# Patient Record
Sex: Female | Born: 1980
Health system: Southern US, Community
[De-identification: ages and names within clinical notes are randomized; demographics above are authoritative.]

## PROBLEM LIST (undated history)

## (undated) DIAGNOSIS — E785 Hyperlipidemia, unspecified: Secondary | ICD-10-CM

## (undated) DIAGNOSIS — G43909 Migraine, unspecified, not intractable, without status migrainosus: Secondary | ICD-10-CM

## (undated) DIAGNOSIS — O905 Postpartum thyroiditis: Secondary | ICD-10-CM

## (undated) HISTORY — DX: Migraine, unspecified, not intractable, without status migrainosus: G43.909

## (undated) HISTORY — DX: Hyperlipidemia, unspecified: E78.5

## (undated) HISTORY — DX: Postpartum thyroiditis: O90.5

## (undated) HISTORY — PX: WISDOM TOOTH EXTRACTION: SHX21

---

## 2003-07-07 ENCOUNTER — Emergency Department (HOSPITAL_COMMUNITY): Admission: EM | Admit: 2003-07-07 | Discharge: 2003-07-08 | Payer: Self-pay | Admitting: Emergency Medicine

## 2009-05-18 ENCOUNTER — Encounter: Payer: Self-pay | Admitting: Family Medicine

## 2009-05-18 LAB — CONVERTED CEMR LAB: Pap Smear: NORMAL

## 2010-02-11 ENCOUNTER — Inpatient Hospital Stay (HOSPITAL_COMMUNITY): Admission: AD | Admit: 2010-02-11 | Discharge: 2010-02-11 | Payer: Self-pay | Admitting: Obstetrics & Gynecology

## 2010-02-11 ENCOUNTER — Ambulatory Visit: Payer: Self-pay | Admitting: Obstetrics & Gynecology

## 2010-02-25 ENCOUNTER — Ambulatory Visit: Payer: Self-pay | Admitting: Family Medicine

## 2010-02-25 DIAGNOSIS — E7800 Pure hypercholesterolemia, unspecified: Secondary | ICD-10-CM

## 2010-02-25 DIAGNOSIS — N926 Irregular menstruation, unspecified: Secondary | ICD-10-CM | POA: Insufficient documentation

## 2010-02-25 DIAGNOSIS — G43909 Migraine, unspecified, not intractable, without status migrainosus: Secondary | ICD-10-CM | POA: Insufficient documentation

## 2010-03-08 ENCOUNTER — Encounter: Payer: Self-pay | Admitting: Family Medicine

## 2010-03-08 DIAGNOSIS — J309 Allergic rhinitis, unspecified: Secondary | ICD-10-CM | POA: Insufficient documentation

## 2010-03-25 ENCOUNTER — Ambulatory Visit: Payer: Self-pay | Admitting: Family Medicine

## 2010-03-25 DIAGNOSIS — J019 Acute sinusitis, unspecified: Secondary | ICD-10-CM

## 2010-05-22 ENCOUNTER — Ambulatory Visit
Admission: RE | Admit: 2010-05-22 | Discharge: 2010-05-22 | Payer: Self-pay | Source: Home / Self Care | Attending: Family Medicine | Admitting: Family Medicine

## 2010-05-22 ENCOUNTER — Other Ambulatory Visit: Payer: Self-pay | Admitting: Family Medicine

## 2010-05-22 ENCOUNTER — Encounter: Payer: Self-pay | Admitting: Family Medicine

## 2010-05-22 ENCOUNTER — Other Ambulatory Visit
Admission: RE | Admit: 2010-05-22 | Discharge: 2010-05-22 | Payer: Self-pay | Source: Home / Self Care | Admitting: Family Medicine

## 2010-05-22 LAB — CYTOLOGY - PAP: Pap Smear: NORMAL

## 2010-05-23 LAB — CONVERTED CEMR LAB
ALT: 11 units/L (ref 0–35)
AST: 17 units/L (ref 0–37)
Albumin: 4.4 g/dL (ref 3.5–5.2)
Alkaline Phosphatase: 77 units/L (ref 39–117)
BUN: 11 mg/dL (ref 6–23)
CO2: 23 meq/L (ref 19–32)
Calcium: 9.8 mg/dL (ref 8.4–10.5)
Chloride: 103 meq/L (ref 96–112)
Cholesterol: 167 mg/dL (ref 0–200)
Creatinine, Ser: 0.92 mg/dL (ref 0.40–1.20)
Glucose, Bld: 71 mg/dL (ref 70–99)
HDL: 49 mg/dL (ref >39)
LDL Cholesterol: 96 mg/dL (ref 0–99)
Potassium: 4.1 meq/L (ref 3.5–5.3)
Sodium: 138 meq/L (ref 135–145)
Total Bilirubin: 0.6 mg/dL (ref 0.3–1.2)
Total CHOL/HDL Ratio: 3.4
Total Protein: 7.3 g/dL (ref 6.0–8.3)
Triglycerides: 109 mg/dL (ref <150)
VLDL: 22 mg/dL (ref 0–40)

## 2010-06-04 NOTE — Assessment & Plan Note (Signed)
Summary: NOV irreg menses/ migraines   Vital Signs:  Patient profile:   30 year old female Height:      64.5 inches Weight:      123 pounds BMI:     20.86 O2 Sat:      75 % on Room air Pulse rate:   75 / minute BP sitting:   121 / 78  (left arm) Cuff size:   regular  Vitals Entered By: Payton Spark CMA (February 25, 2010 11:20 AM)  O2 Flow:  Room air CC: New to est. Refill medications.   Primary Care Provider:  Seymour Bars DO  CC:  New to est. Refill medications..  History of Present Illness: 30 yo WF presents for NOV.  Transferring from [facility].  She has been on Simvastatin 40 mg/ day for about a year.  She has hereditary hyperlipidemia.  She had labs checked 02-13-10 and was told everything was OK.  Her dad has high cholesterol.  She is on Verapamil for migraine prevention with Frova for rescue.  On Zovia for birth control this was started 10-6 after she had breakthrough menorrhagia on Jolessa.    She sees OB-- [place]. Menstrual migraine, doing Zovia with extended cycling.      Habits & Providers  Alcohol-Tobacco-Diet     Alcohol drinks/day: <1     Tobacco Status: never  Exercise-Depression-Behavior     Does Patient Exercise: yes     Have you felt down or hopeless? no     STD Risk: never     Drug Use: never     Seat Belt Use: always  Past History:  Past Medical History: migraines hereditary hyperlipidemia G0  Past Surgical History: none  Family History: father AMI, died at 32, high cholesterol PGF AMI, 24 mother healthy sister healthy  Social History: Never smoked. rare ETOH. 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 [place], no kids.Smoking Status:  never Seat Belt Use:  always Does Patient Exercise:  yes STD Risk:  never Drug Use:  never  Review of Systems       no fevers/sweats/weakness, unexplained wt loss/gain, no change in vision, no difficulty hearing, ringing in ears, no hay fever/allergies, no CP/discomfort, no  palpitations, no breast lump/nipple discharge, no cough/wheeze, no blood in stool, no N/V/D, no nocturia, no leaking urine, no unusual vag bleeding, no vaginal/penile discharge, no muscle/joint pain, no rash, no new/changing mole, no HA, no memory loss, no anxiety, no sleep problem, no depression, no unexplained lumps, no easy bruising/bleeding, no concern with sexual function   Physical Exam  General:  alert, well-developed, well-nourished, and well-hydrated.   Head:  normocephalic and atraumatic.   Mouth:  good dentition and pharynx pink and moist.   Neck:  no masses.   Lungs:  Normal respiratory effort, chest expands symmetrically. Lungs are clear to auscultation, no crackles or wheezes. Heart:  Normal rate and regular rhythm. S1 and S2 normal without gallop, murmur, click, rub or other extra sounds. Skin:  color normal.   Cervical Nodes:  No lymphadenopathy noted Psych:  good eye contact, not anxious appearing, and not depressed appearing.     Impression & Recommendations:  Problem # 1:  IRREGULAR MENSTRUAL CYCLE (ICD-626.4) Assessment Improved Improved with change from [place] to [place] about 3 wks ago.  Continue, doing extended cycling for migraine prevention. Schedule PAP in Jan. Call if any more breakthrough bleeding occurs.   Her updated medication list for this problem includes:    Zovia  1/35e (28) 1-35 Mg-mcg Tabs (Ethynodiol diac-eth estradiol) .Marland Kitchen... Take 1 tab by mouth once daily  Problem # 2:  PURE HYPERCHOLESTEROLEMIA (ICD-272.0) Hereditary.  Just had labs drawn.  Will call for report.  On OCPs and discussed that when she does want to conceive, we will stop her Simvastatin.     Her updated medication list for this problem includes:    Simvastatin 40 Mg Tabs (Simvastatin) .Marland Kitchen... Take 1 tab by mouth at bedtime  Complete Medication List: 1)  Simvastatin 40 Mg Tabs (Simvastatin) .... Take 1 tab by mouth at bedtime 2)  Verapamil Hcl Cr 240 Mg Cr-tabs (Verapamil hcl) ....  Take 1 tab by mouth once daily 3)  Zovia 1/35e (28) 1-35 Mg-mcg Tabs (Ethynodiol diac-eth estradiol) .... Take 1 tab by mouth once daily 4)  Frova 2.5 Mg Tabs (Frovatriptan succinate) .... Take as directed as needed for migraines  Patient Instructions: 1)  Stay on current meds. 2)  Have pharmacy contact us for any RFs that you may need. 3)  Schedule CPE with PAP the end of Jan.   Orders Added: 1)  New Patient Level III [99203]  Appended Document: NOV irreg menses/ migraines     Vital Signs:  Patient profile:   30 year old female O2 Sat:      100 % on Room air  O2 Flow:  Room air   Complete Medication List: 1)  Simvastatin 40 Mg Tabs (Simvastatin) .... Take 1 tab by mouth at bedtime 2)  Verapamil Hcl Cr 240 Mg Cr-tabs (Verapamil hcl) .... Take 1 tab by mouth once daily 3)  Zovia 1/35e (28) 1-35 Mg-mcg Tabs (Ethynodiol diac-eth estradiol) .... Take 1 tab by mouth once daily 4)  Frova 2.5 Mg Tabs (Frovatriptan succinate) .... Take as directed as needed for migraines       * See corrected O2 SAT

## 2010-06-04 NOTE — Miscellaneous (Signed)
Summary: old records  Clinical Lists Changes  Problems: Added new problem of ALLERGIC RHINITIS (ICD-477.9) Allergies: Added new allergy or adverse reaction of IMITREX Observations: Added new observation of PAP DUE: 05/18/2010 (03/08/2010 16:53) Added new observation of TDBOOSTDUE: 12/27/2013 (03/08/2010 16:53) Added new observation of PAST SURG HX: wisdom teeth (03/08/2010 16:53) Added new observation of PAST MED HX: migraines hereditary hyperlipidemia G1P0010  had HPV vaccine 2007 (03/08/2010 16:53) Added new observation of NKA: F (03/08/2010 16:53) Added new observation of FAMILY HX: father AMI, died at 63, high cholesterol PGF AMI, 66 mother  alive, high chol sister healthy (03/08/2010 16:53) Added new observation of PRIMARY MD: Seymour Bars DO (03/08/2010 16:53) Added new observation of LAST PAP DAT: 05/18/2009 (05/18/2009 16:59) Added new observation of PAP SMEAR: normal (05/18/2009 16:59) Added new observation of TD BOOSTER: given (12/28/2003 16:56)       Past History:  Past Medical History: migraines hereditary hyperlipidemia G1P0010  had HPV vaccine 2007  Past Surgical History: wisdom teeth   Family History: father AMI, died at 31, high cholesterol PGF AMI, 77 mother  alive, high chol sister healthy    TD Result Date:  12/28/2003 TD Result:  given TD Next Due:  10 yr PAP Result Date:  05/18/2009 PAP Result:  normal PAP Next Due:  1 yr

## 2010-06-04 NOTE — Assessment & Plan Note (Signed)
Summary: Sinusitis   Vital Signs:  Patient profile:   30 year old female Height:      64.5 inches Weight:      123 pounds Temp:     98.0 degrees F oral Pulse rate:   78 / minute BP sitting:   122 / 85  (right arm) Cuff size:   regular  Vitals Entered By: Avon Gully CMA, Duncan Dull) (March 25, 2010 10:42 AM) CC: sinus congestion x 1 weeks, cough so much back hurts   Primary Care Provider:  Seymour Bars DO  CC:  sinus congestion x 1 weeks and cough so much back hurts.  History of Present Illness: sinus congestion x 1 weeks, cough so much back hurts.  Last Wednesday started with burning throat and then post nasal drip. Then over the weekend has severe sinus congestion which is triggering her migraines. + runny nose. Cough. deeper in her chest.  No fever.  No cough or cough meds. No ear pain. Some bilat jaw pain.  + nauseated. No change in bowels.  No allegy hx.   Current Medications (verified): 1)  Simvastatin 40 Mg Tabs (Simvastatin) .... Take 1 Tab By Mouth At Bedtime 2)  Verapamil Hcl Cr 240 Mg Cr-Tabs (Verapamil Hcl) .... Take 1 Tab By Mouth Once Daily 3)  Zovia 1/35e (28) 1-35 Mg-Mcg Tabs (Ethynodiol Diac-Eth Estradiol) .... Take 1 Tab By Mouth Once Daily 4)  Frova 2.5 Mg Tabs (Frovatriptan Succinate) .... Take As Directed As Needed For Migraines  Allergies (verified): 1)  ! Imitrex  Comments:  Nurse/Medical Assistant: The patient's medications and allergies were reviewed with the patient and were updated in the Medication and Allergy Lists. Avon Gully CMA, Duncan Dull) (March 25, 2010 10:43 AM)  Physical Exam  General:  Well-developed,well-nourished,in no acute distress; alert,appropriate and cooperative throughout examination Head:  Normocephalic and atraumatic without obvious abnormalities. No apparent alopecia or balding. Eyes:  No corneal or conjunctival inflammation noted. EOMI. Perrla.  Ears:  External ear exam shows no significant lesions or deformities.   Otoscopic examination reveals clear canals, tympanic membranes are intact bilaterally without bulging, retraction, inflammation or discharge. Hearing is grossly normal bilaterally. Nose:  External nasal examination shows no deformity or inflammation. Nasal mucosa are pink and moist without lesions or exudates. Mouth:  Oral mucosa and oropharynx without lesions or exudates.  Teeth in good repair. Neck:  No deformities, masses, or tenderness noted. Lungs:  Normal respiratory effort, chest expands symmetrically. Lungs are clear to auscultation, no crackles or wheezes. Heart:  Normal rate and regular rhythm. S1 and S2 normal without gallop, murmur, click, rub or other extra sounds. Skin:  no rashes.   Cervical Nodes:  No lymphadenopathy noted Psych:  Cognition and judgment appear intact. Alert and cooperative with normal attention span and concentration. No apparent delusions, illusions, hallucinations   Impression & Recommendations:  Problem # 1:  SINUSITIS - ACUTE-NOS (ICD-461.9)  Her updated medication list for this problem includes:    Amoxicillin 875 Mg Tabs (Amoxicillin) .Marland Kitchen... Take 1 tablet by mouth two times a day for 10 days .    Hydrocodone-homatropine 5-1.5 Mg/74ml Syrp (Hydrocodone-homatropine) .Marland KitchenMarland KitchenMarland KitchenMarland Kitchen 5ml by mouth at bedtime as needed for cough  Instructed on treatment. Call if symptoms persist or worsen. I did warn her that the antiobiotic can decrease the effectiveness of ehr OCPs.    Complete Medication List: 1)  Simvastatin 40 Mg Tabs (Simvastatin) .... Take 1 tab by mouth at bedtime 2)  Verapamil Hcl Cr 240 Mg Cr-tabs (Verapamil  hcl) .... Take 1 tab by mouth once daily 3)  Zovia 1/35e (28) 1-35 Mg-mcg Tabs (Ethynodiol diac-eth estradiol) .... Take 1 tab by mouth once daily 4)  Frova 2.5 Mg Tabs (Frovatriptan succinate) .... Take as directed as needed for migraines 5)  Amoxicillin 875 Mg Tabs (Amoxicillin) .... Take 1 tablet by mouth two times a day for 10 days . 6)   Hydrocodone-homatropine 5-1.5 Mg/62ml Syrp (Hydrocodone-homatropine) .... 5ml by mouth at bedtime as needed for cough  Patient Instructions: 1)  Call if not better in one week 2)  Please fill the antibiotic if you are not better by the end of the week 3)  The cough medicine can be sedating so don't take it and then drive.  Prescriptions: HYDROCODONE-HOMATROPINE 5-1.5 MG/5ML SYRP (HYDROCODONE-HOMATROPINE) 5ml by mouth at bedtime as needed for cough  #120 ml x 0   Entered and Authorized by:   Nani Gasser MD   Signed by:   Nani Gasser MD on 03/25/2010   Method used:   Print then Give to Patient   RxID:   [id number] AMOXICILLIN 875 MG TABS (AMOXICILLIN) Take 1 tablet by mouth two times a day for 10 days .  #20 x 0   Entered and Authorized by:   Nani Gasser MD   Signed by:   Nani Gasser MD on 03/25/2010   Method used:   Print then Give to Patient   RxID:   [id number]    Orders Added: 1)  Est. Patient Level III [38756]

## 2010-06-04 NOTE — Letter (Signed)
Summary: Records Dated 05-18-09 thru 02-05-10/[facility]   Records Dated 05-18-09 thru 02-05-10/[facility]   Imported By: Lanelle Bal 03/20/2010 10:21:45  _____________________________________________________________________  External Attachment:    Type:   Image     Comment:   External Document

## 2010-06-06 NOTE — Assessment & Plan Note (Signed)
Summary: CPE with pap   Vital Signs:  Patient profile:   30 year old female Height:      64.5 inches Weight:      123 pounds BMI:     20.86 Pulse rate:   91 / minute BP sitting:   118 / 80  (right arm) Cuff size:   regular  Vitals Entered By: Avon Gully CMA, Duncan Dull) (May 22, 2010 8:19 AM) CC: CPE and pap   Primary Care Provider:  Seymour Bars DO  CC:  CPE and pap.  History of Present Illness: 30 yo WF, nulligravid with hereditary hyperlipidemia and migraines is here for CPE with pap.  she is due for fasting labs.  Doing well on current meds with Zovia for birth control. She and her husband have decided to try and start a family later this year.  She understands that she will need to stop all of her meds when she stops her Zovia.    Denies fam hx of colon or breast cancer.  She does have fam hx of premature heart dz.  she eats healthy, exercises, takes her Simvastatin, has excellent BP and does not smoke.  She denies any CP or DOE.    She thinks her tetanus is up to date.  She is married, monogamous without hx of abnormal pap smear.    Current Medications (verified): 1)  Simvastatin 40 Mg Tabs (Simvastatin) .... Take 1 Tab By Mouth At Bedtime 2)  Verapamil Hcl Cr 240 Mg Cr-Tabs (Verapamil Hcl) .... Take 1 Tab By Mouth Once Daily 3)  Zovia 1/35e (28) 1-35 Mg-Mcg Tabs (Ethynodiol Diac-Eth Estradiol) .... Take 1 Tab By Mouth Once Daily 4)  Frova 2.5 Mg Tabs (Frovatriptan Succinate) .... Take As Directed As Needed For Migraines  Allergies (verified): 1)  ! Imitrex  Comments:  Nurse/Medical Assistant: The patient's medications and allergies were reviewed with the patient and were updated in the Medication and Allergy Lists. Avon Gully CMA, Duncan Dull) (May 22, 2010 8:20 AM)  Past History:  Past Medical History: Reviewed history from 03/08/2010 and no changes required. migraines hereditary hyperlipidemia G1P0010  had HPV vaccine 2007  Family  History: Reviewed history from 03/08/2010 and no changes required. father AMI, died at 7, high cholesterol PGF AMI, 58 mother  alive, high chol sister healthy  Social History: Reviewed history from 02/25/2010 and no changes required. Never smoked. rare ETOH. 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  Review of Systems  The patient denies anorexia, fever, weight loss, weight gain, vision loss, decreased hearing, hoarseness, chest pain, syncope, dyspnea on exertion, peripheral edema, prolonged cough, headaches, hemoptysis, abdominal pain, melena, hematochezia, severe indigestion/heartburn, hematuria, incontinence, genital sores, muscle weakness, suspicious skin lesions, transient blindness, difficulty walking, depression, unusual weight change, abnormal bleeding, enlarged lymph nodes, angioedema, breast masses, and testicular masses.    Physical Exam  General:  alert, well-developed, well-nourished, and well-hydrated.   Head:  normocephalic and atraumatic.   Ears:  no external deformities.   Nose:  no nasal discharge.   Mouth:  good dentition and pharynx pink and moist.   Neck:  no masses.   Breasts:  No mass, nodules, thickening, tenderness, bulging, retraction, inflamation, nipple discharge or skin changes noted.   Lungs:  Normal respiratory effort, chest expands symmetrically. Lungs are clear to auscultation, no crackles or wheezes. Heart:  Normal rate and regular rhythm. S1 and S2 normal without gallop, murmur, click, rub or other extra sounds. Abdomen:  Bowel sounds positive,abdomen soft and non-tender without masses, organomegaly or hernias noted. Genitalia:  Pelvic Exam:        External: normal female genitalia without lesions or masses        Vagina: normal without lesions or masses        Cervix: normal without lesions or masses        Adnexa: normal bimanual exam without masses or fullness        Uterus: normal by palpation        Pap  smear: performed Pulses:  2+ radial and pedal pulses Extremities:  no LE edema Skin:  color normal and no suspicious lesions.   Cervical Nodes:  No lymphadenopathy noted Psych:  good eye contact, not anxious appearing, and not depressed appearing.     Impression & Recommendations:  Problem # 1:  ROUTINE GYNECOLOGICAL EXAMINATION (ICD-V72.31) Keeping healthy checlist for women reviewed. BP at goal. Meds RFd. Thin prep pap today. Fasting labs today. Tetanus UTD. Cardiac stress testing at 35 due to fam hx. Discussed family planning.  Pt will STOP her Verapamil (for migraine prevention) and Simvastatin and Frova when she comes off her Zovia.    Complete Medication List: 1)  Simvastatin 40 Mg Tabs (Simvastatin) .... Take 1 tab by mouth at bedtime 2)  Verapamil Hcl Cr 240 Mg Cr-tabs (Verapamil hcl) .... Take 1 tab by mouth once daily 3)  Zovia 1/35e (28) 1-35 Mg-mcg Tabs (Ethynodiol diac-eth estradiol) .... Take 1 tab by mouth once daily 4)  Frova 2.5 Mg Tabs (Frovatriptan succinate) .... Take as directed as needed for migraines  Other Orders: T-Comprehensive Metabolic Panel [id number]) T-Lipid Profile ([id number]) Prescriptions: FROVA 2.5 MG TABS (FROVATRIPTAN SUCCINATE) Take as directed as needed for migraines  #9 x 2   Entered and Authorized by:   Seymour Bars DO   Signed by:   Seymour Bars DO on 05/22/2010   Method used:   Print then Give to Patient   RxID:   [id number] ZOVIA 1/35E (28) 1-35 MG-MCG TABS (ETHYNODIOL DIAC-ETH ESTRADIOL) Take 1 tab by mouth once daily  #3packs x 3   Entered and Authorized by:   Seymour Bars DO   Signed by:   Seymour Bars DO on 05/22/2010   Method used:   Print then Give to Patient   RxID:   [id number] SIMVASTATIN 40 MG TABS (SIMVASTATIN) Take 1 tab by mouth at bedtime  #90 x 1   Entered and Authorized by:   Seymour Bars DO   Signed by:   Seymour Bars DO on 05/22/2010   Method used:   Print then Give to Patient   RxID:    [id number] VERAPAMIL HCL CR 240 MG CR-TABS (VERAPAMIL HCL) Take 1 tab by mouth once daily  #90 x 1   Entered and Authorized by:   Seymour Bars DO   Signed by:   Seymour Bars DO on 05/22/2010   Method used:   Electronically to        [facility] [address] (retail)       [address] [place]       [place], [place]  [address]       Ph: [phone]       Fax: [phone]   RxID:   [id number]    Orders Added: 1)  T-Comprehensive Metabolic Panel [[id number]] 2)  T-Lipid Profile [[id number]] 3)  Est. Patient age 37-39 [phone]

## 2010-06-16 ENCOUNTER — Encounter: Payer: Self-pay | Admitting: Family Medicine

## 2010-07-02 NOTE — Letter (Signed)
Summary: [facility]  [facility]   Imported By: Maryln Gottron 06/24/2010 15:27:45  _____________________________________________________________________  External Attachment:    Type:   Image     Comment:   External Document

## 2010-07-18 LAB — CBC
HCT: 40.1 % (ref 36.0–46.0)
MCH: 31.5 pg (ref 26.0–34.0)
MCHC: 35 g/dL (ref 30.0–36.0)
MCV: 90 fL (ref 78.0–100.0)
RDW: 12.8 % (ref 11.5–15.5)

## 2010-07-18 LAB — URINALYSIS, ROUTINE W REFLEX MICROSCOPIC
Bilirubin Urine: NEGATIVE
Glucose, UA: NEGATIVE mg/dL
Ketones, ur: NEGATIVE mg/dL
Leukocytes, UA: NEGATIVE
Nitrite: NEGATIVE
Protein, ur: NEGATIVE mg/dL
Specific Gravity, Urine: 1.02 (ref 1.005–1.030)
Urobilinogen, UA: 0.2 mg/dL (ref 0.0–1.0)
pH: 6.5 (ref 5.0–8.0)

## 2010-07-18 LAB — WET PREP, GENITAL

## 2010-07-18 LAB — POCT PREGNANCY, URINE: Preg Test, Ur: NEGATIVE

## 2010-07-18 LAB — URINE MICROSCOPIC-ADD ON

## 2010-11-07 ENCOUNTER — Other Ambulatory Visit: Payer: Self-pay | Admitting: Family Medicine

## 2010-11-25 ENCOUNTER — Telehealth: Payer: Self-pay | Admitting: *Deleted

## 2010-11-25 DIAGNOSIS — G43909 Migraine, unspecified, not intractable, without status migrainosus: Secondary | ICD-10-CM

## 2010-11-25 NOTE — Telephone Encounter (Signed)
Pt states she is currently seeing Dr. Orie Rout but would like a new referral to Dr. Vallery Ridge at [facility] bc she not happy w/ Dr. Demetrius Charity  please advise.

## 2010-11-25 NOTE — Telephone Encounter (Signed)
done

## 2011-03-02 ENCOUNTER — Encounter: Payer: Self-pay | Admitting: Family Medicine

## 2011-03-06 ENCOUNTER — Encounter: Payer: Self-pay | Admitting: Family Medicine

## 2011-03-06 ENCOUNTER — Ambulatory Visit (INDEPENDENT_AMBULATORY_CARE_PROVIDER_SITE_OTHER): Payer: BC Managed Care – PPO | Admitting: Family Medicine

## 2011-03-06 DIAGNOSIS — E785 Hyperlipidemia, unspecified: Secondary | ICD-10-CM

## 2011-03-06 MED ORDER — COLESEVELAM HCL 3.75 G PO PACK: PACK | ORAL | Status: DC

## 2011-03-06 NOTE — Progress Notes (Signed)
  Subjective:    Patient ID: Tina Pearson, female    DOB: 08/22/1980, 30 y.o.   MRN: [id number]  HPI  Here to f/u up on cholesterol. She if very fearful of heart dz since her father died early. She knows she can't take statins since she is trying to get pregnant but wants to be on something.   Review of Systems     Objective:   Physical Exam  Constitutional: She is oriented to person, place, and time. She appears well-developed and well-nourished.       Very thin female  HENT:  Head: Normocephalic and atraumatic.  Cardiovascular: Normal rate, regular rhythm and normal heart sounds.   Pulmonary/Chest: Effort normal and breath sounds normal.  Neurological: She is alert and oriented to person, place, and time.  Skin: Skin is warm and dry.  Psychiatric: She has a normal mood and affect. Her behavior is normal.          Assessment & Plan:  Hyperlipidemia- I told her that we could hold off on closer medication while she is trying to get pregnant during her pregnancy. She is really worried about the potential for future heart disease and really wants to take something. discussed can use Welchol, it is category B. there are limited studies in the first trimester but has been studied in the second and third trimester and is considered safe. I discussed with her that she can certainly start it now and when she finds out she is pregnant she can stop the medication for the first trimester and then restarted during her second trimester. Discussed options of pills versus the pallor. She prefers the powder. I explained to her that we will not recheck blood work as this is not a medication when the dose is titrated. There is only a single dosing. It really doesn't matter how much it does or doesn't lower her cholesterol and so she is completed her pregnancy and nursing and is able to restart a statin.

## 2011-03-14 ENCOUNTER — Telehealth: Payer: Self-pay | Admitting: *Deleted

## 2011-03-14 NOTE — Telephone Encounter (Signed)
Unfortunately we really don't have any other options that are safe in pregnancy. Will have to start something after has a baby.

## 2011-03-14 NOTE — Telephone Encounter (Signed)
Pt calls and states she can not take the cholesterol med you put her on at last visit. States its making her feel really sick and request a different med

## 2011-03-14 NOTE — Telephone Encounter (Signed)
Can try OTC niacin once a day and this should be safe. Rx version of niacin are contraindicated.

## 2011-03-14 NOTE — Telephone Encounter (Signed)
Pt notified of MD instructions via VM. KJ LPN 

## 2011-03-14 NOTE — Telephone Encounter (Signed)
Pt calls back and states she is not pregnant right now. But is actively trying. Said youtold her there was something else she could try if that med did not workl.

## 2011-04-04 ENCOUNTER — Encounter: Payer: Self-pay | Admitting: Family Medicine

## 2011-04-08 ENCOUNTER — Ambulatory Visit: Payer: BC Managed Care – PPO | Admitting: Family Medicine

## 2011-04-11 ENCOUNTER — Ambulatory Visit (INDEPENDENT_AMBULATORY_CARE_PROVIDER_SITE_OTHER): Payer: BC Managed Care – PPO | Admitting: Family Medicine

## 2011-04-11 ENCOUNTER — Encounter: Payer: Self-pay | Admitting: Family Medicine

## 2011-04-11 VITALS — BP 93/64 | HR 85 | Wt 110.0 lb

## 2011-04-11 DIAGNOSIS — N912 Amenorrhea, unspecified: Secondary | ICD-10-CM

## 2011-04-11 LAB — POCT URINE PREGNANCY: Preg Test, Ur: NEGATIVE

## 2011-04-11 MED ORDER — MEDROXYPROGESTERONE ACETATE 10 MG PO TABS: 10.0000 mg | ORAL_TABLET | Freq: Every day | ORAL | Status: DC

## 2011-04-11 NOTE — Progress Notes (Signed)
  Subjective:    Patient ID: Tina Pearson, female    DOB: [birth year], 30 y.o.   MRN: [id number]  HPI Her last period was 10/16.  Last natural period was June 20th after stopping her birth control. Stopped bith control in early June.  Went o GYN when periods didn't restart. Did this 02/06/11 and this induced a period.  She is trying to get pregnant though she doen't think she is pregnant.    Review of Systems     Objective:   Physical Exam  Constitutional: She is oriented to person, place, and time. She appears well-developed and well-nourished.  HENT:  Head: Normocephalic and atraumatic.  Cardiovascular: Normal rate, regular rhythm and normal heart sounds.   Pulmonary/Chest: Effort normal and breath sounds normal.  Neurological: She is alert and oriented to person, place, and time.  Skin: Skin is warm and dry.  Psychiatric: She has a normal mood and affect. Her behavior is normal.          Assessment & Plan:  Amenorrhea - Dicussed will use provera to restart her period. If this doesn't trigger her cycyle then needs to see gyn for further eval esp since she is trying to get pregnant. Also  Make sure taking a prenatal vitamin. She would like ot see a new gyn so will refer down the hall.

## 2011-04-11 NOTE — Patient Instructions (Signed)
Make sure taking a prenatal vitamin.   If this doesn't restart your period then recommend follow up with GYN

## 2011-05-16 ENCOUNTER — Ambulatory Visit (INDEPENDENT_AMBULATORY_CARE_PROVIDER_SITE_OTHER): Payer: BC Managed Care – PPO | Admitting: Physician Assistant

## 2011-05-16 ENCOUNTER — Encounter: Payer: Self-pay | Admitting: Physician Assistant

## 2011-05-16 VITALS — BP 124/79 | HR 98 | Temp 98.4°F | Ht 63.0 in | Wt 113.0 lb

## 2011-05-16 DIAGNOSIS — J329 Chronic sinusitis, unspecified: Secondary | ICD-10-CM

## 2011-05-16 MED ORDER — AZITHROMYCIN 250 MG PO TABS: ORAL_TABLET | ORAL | Status: AC

## 2011-05-16 NOTE — Progress Notes (Signed)
  Subjective:    Patient ID: Tina Pearson, female    DOB: [birth year], 31 y.o.   MRN: [id number]  HPI Patient had the flu the Monday after Thanksgiving. She really has not felted well since then. She went to [facility] in [place] on Dec.15th she was given antibiotic for 10 days. She finished abx and felt better for about 3 days after finishing prescription. She is continually getting worse. She reports sinus pressure, pressure behind eyes, sore throat, jaw pain, increased migraines and nasal congestion. She denies fever, chills, nausea/vomiting, wheezing, or SOB.   Review of Systems     Objective:   Physical Exam  Constitutional: She is oriented to person, place, and time. She appears well-developed and well-nourished. No distress.  HENT:  Head: Normocephalic and atraumatic.  Right Ear: External ear normal.  Left Ear: External ear normal.  Mouth/Throat: No oropharyngeal exudate.       Oropharynx with post-nasal drip present. Bilaterally maxillary tenderness.  Erythematous turbinates with edema.  Eyes: Right eye exhibits no discharge. Left eye exhibits no discharge.  Neck: Normal range of motion. Neck supple.  Cardiovascular: Normal rate, regular rhythm and normal heart sounds.   Pulmonary/Chest: Effort normal and breath sounds normal. She has no wheezes. She exhibits no tenderness.  Neurological: She is alert and oriented to person, place, and time.  Skin: Skin is warm and dry. She is not diaphoretic.  Psychiatric: She has a normal mood and affect. Her behavior is normal.          Assessment & Plan:  Sinusitis- Considered prednisone; however, she is trying to get pregnant and she did not want to take anything that might be harmful if she was early in pregnancy. Prescribed Zpak to start. Encouraged patient to start a sinus rinse such as neil med. Symptomatic care for sore throat. Told patient could give intranasal steroid to help with nasal congestion she did not want at this time  said she would call back if she wanted. Encouraged her to call back if not better in 1 week.

## 2011-05-16 NOTE — Patient Instructions (Signed)
Lloyd Huger Med sinus rinse could help with sinus pressure and congestion. Start Zpak. Salt water gargle and honey. Call office if not feeling better in 1 week.

## 2011-06-10 ENCOUNTER — Encounter: Payer: BC Managed Care – PPO | Admitting: Obstetrics & Gynecology

## 2011-12-08 ENCOUNTER — Ambulatory Visit (INDEPENDENT_AMBULATORY_CARE_PROVIDER_SITE_OTHER): Payer: BC Managed Care – PPO | Admitting: Family Medicine

## 2011-12-08 ENCOUNTER — Encounter: Payer: Self-pay | Admitting: Family Medicine

## 2011-12-08 VITALS — BP 120/81 | HR 90 | Wt 110.0 lb

## 2011-12-08 DIAGNOSIS — Z3201 Encounter for pregnancy test, result positive: Secondary | ICD-10-CM

## 2011-12-08 DIAGNOSIS — G43909 Migraine, unspecified, not intractable, without status migrainosus: Secondary | ICD-10-CM

## 2011-12-08 LAB — POCT URINE PREGNANCY: Preg Test, Ur: POSITIVE

## 2011-12-08 MED ORDER — PRENATAL VITAMINS (DIS) PO TABS: 1.0000 | ORAL_TABLET | Freq: Every day | ORAL | Status: DC

## 2011-12-08 MED ORDER — CYCLOBENZAPRINE HCL 10 MG PO TABS: 10.0000 mg | ORAL_TABLET | Freq: Three times a day (TID) | ORAL | Status: AC | PRN

## 2011-12-08 NOTE — Patient Instructions (Addendum)
Start prenatal vitamin Try tylenol for headache.   We will call you with your lab results. If you don't here from [place] in about a week then please give [place] a call at [phone].

## 2011-12-08 NOTE — Progress Notes (Signed)
  Subjective:    Patient ID: Tina Pearson, female    DOB: [birth year], 31 y.o.   MRN: [id number]  HPI First day of LMP ( July 3rd).  Normal period.  Has had 2 positive home test. Says hasn't taken her Frova. She has had a HA for 3 days.  She has been trying for over a year.     Review of Systems     Objective:   Physical Exam  Constitutional: She is oriented to person, place, and time. She appears well-developed and well-nourished.  HENT:  Head: Normocephalic and atraumatic.  Cardiovascular: Normal rate, regular rhythm and normal heart sounds.   Pulmonary/Chest: Effort normal and breath sounds normal.  Neurological: She is alert and oriented to person, place, and time.  Skin: Skin is warm and dry.  Psychiatric: She has a normal mood and affect. Her behavior is normal.          Assessment & Plan:  Pregnancy - Recommend take prenatal vitamin.  Work on [employer] and exercise. Stop caffeine and alcohol. She doesn't drink alchohol bc of her migraines. Never smoked.  Dates are 4 weeks and 5 days.  EDD is April 9th, 2014.  Discussed OB options.  She will go for lab test for coinfirmation. Avoid foods with nitrates. Happy to make referral to OB.    Migriane - Stop the frova. Can use tylenol and cna use flexeril for rescue. Rx sent.  Flexeril is category B.

## 2011-12-09 LAB — HCG, QUANTITATIVE, PREGNANCY: hCG, Beta Chain, Quant, S: 216.4 m[IU]/mL

## 2011-12-10 ENCOUNTER — Telehealth: Payer: Self-pay | Admitting: *Deleted

## 2011-12-10 NOTE — Telephone Encounter (Signed)
Pt states she called the OB/GYN office down the hall but she has concerns about them like the fact she mainly see the midwife and states they told her she may not know the doctor who deliveries. She would like to know if you have any other recommendations.

## 2011-12-10 NOTE — Telephone Encounter (Signed)
I like Dr. Billy Coast in [place] at [facility].

## 2011-12-11 NOTE — Telephone Encounter (Signed)
Pt informed

## 2012-09-15 ENCOUNTER — Ambulatory Visit (INDEPENDENT_AMBULATORY_CARE_PROVIDER_SITE_OTHER): Payer: BC Managed Care – PPO | Admitting: Physician Assistant

## 2012-09-15 ENCOUNTER — Encounter: Payer: Self-pay | Admitting: Physician Assistant

## 2012-09-15 VITALS — BP 126/86 | HR 102 | Wt 113.0 lb

## 2012-09-15 DIAGNOSIS — G43909 Migraine, unspecified, not intractable, without status migrainosus: Secondary | ICD-10-CM

## 2012-09-15 MED ORDER — KETOROLAC TROMETHAMINE 60 MG/2ML IM SOLN
60.0000 mg | Freq: Once | INTRAMUSCULAR | Status: AC
  Administered 2012-09-15: 60 mg via INTRAMUSCULAR

## 2012-09-15 MED ORDER — PROMETHAZINE HCL 25 MG/ML IJ SOLN
25.0000 mg | Freq: Once | INTRAMUSCULAR | Status: AC
  Administered 2012-09-15: 25 mg via INTRAMUSCULAR

## 2012-09-15 NOTE — Progress Notes (Signed)
  Subjective:    Patient ID: Tina Pearson, female    DOB: 12-Feb-1981, 32 y.o.   MRN: [id number]  HPI Patient presents to the clinic with an ongoing migraine today. She has a long history of migraines and previously had been put on preventative medications. She went off all medications while being pregnant. She had no migraines while being pregnant. This is her first migraine since having her son 4 weeks ago. She is not breastfeeding. She is unaware of any trigger. HA started 2 days ago and continues to get worse. She has taken 2 frova today with no relief. She is on NSAIDS for pain from stitches. She has also taken tylenol. She is sensitive to sound and light. She is very nauseated but has not vomited. Describes pain as 7/10 and left sided that is starting to go up her head.     Review of Systems     Objective:   Physical Exam  Constitutional: She appears well-developed and well-nourished.  HENT:  Head: Normocephalic and atraumatic.  Cardiovascular: Normal rate, regular rhythm and normal heart sounds.   Pulmonary/Chest: Effort normal and breath sounds normal.  Abdominal: Soft. Bowel sounds are normal.  Skin: Skin is warm and dry.  Psychiatric: She has a normal mood and affect. Her behavior is normal.          Assessment & Plan:  Migraine- Toradol 60 and Phenergan 25 were given IM today. Husband was driving her home. Discussed she might need to go back on preventative. Do not see on med list. She was instructed to call if she wanted me to send over script for her to start. Follow up if not improving in next 24-48 hours.

## 2012-09-15 NOTE — Patient Instructions (Signed)
Gave 2 injection in office today.   Call back with preventative and can restart.

## 2012-09-29 ENCOUNTER — Other Ambulatory Visit: Payer: Self-pay | Admitting: *Deleted

## 2012-09-29 MED ORDER — FROVATRIPTAN SUCCINATE 2.5 MG PO TABS: ORAL_TABLET | ORAL | Status: DC

## 2012-09-29 MED ORDER — VERAPAMIL HCL ER 120 MG PO TBCR: 240.0000 mg | EXTENDED_RELEASE_TABLET | Freq: Every day | ORAL | Status: DC

## 2012-09-29 NOTE — Progress Notes (Signed)
Pt called and stated that per her last OV that Tina Pearson would restart her migraine meds.Tina Pearson made note of this in her assessment & plan) Rx have been sent to patients pharmacy.Loralee Pacas [place]

## 2012-12-13 ENCOUNTER — Other Ambulatory Visit: Payer: Self-pay | Admitting: Family Medicine

## 2012-12-13 ENCOUNTER — Encounter: Payer: Self-pay | Admitting: Family Medicine

## 2012-12-13 ENCOUNTER — Ambulatory Visit (INDEPENDENT_AMBULATORY_CARE_PROVIDER_SITE_OTHER): Payer: BC Managed Care – PPO | Admitting: Family Medicine

## 2012-12-13 VITALS — BP 116/83 | HR 89 | Temp 98.1°F | Wt 117.0 lb

## 2012-12-13 DIAGNOSIS — E010 Iodine-deficiency related diffuse (endemic) goiter: Secondary | ICD-10-CM

## 2012-12-13 DIAGNOSIS — J019 Acute sinusitis, unspecified: Secondary | ICD-10-CM

## 2012-12-13 DIAGNOSIS — E049 Nontoxic goiter, unspecified: Secondary | ICD-10-CM

## 2012-12-13 MED ORDER — AMOXICILLIN-POT CLAVULANATE 875-125 MG PO TABS: 1.0000 | ORAL_TABLET | Freq: Two times a day (BID) | ORAL | Status: DC

## 2012-12-13 NOTE — Progress Notes (Signed)
  Subjective:    Patient ID: Tina Pearson, female    DOB: Sep 23, 1980, 32 y.o.   MRN: [id number]  HPI Nasal congestion-x 2 weeks, tried mucinex and tylenol blowing out yellow phlegm x 4 days. Now moving into her chest.  No decongestant. No fever.  No ST.  No HA. Left sided maxillary pressre, worse with cough.      Review of Systems     Objective:   Physical Exam  Constitutional: She is oriented to person, place, and time. She appears well-developed and well-nourished.  HENT:  Head: Normocephalic and atraumatic.  Right Ear: External ear normal.  Left Ear: External ear normal.  Nose: Nose normal.  Mouth/Throat: Oropharynx is clear and moist.  TMs and canals are clear.   Eyes: Conjunctivae and EOM are normal. Pupils are equal, round, and reactive to light.  Neck: Neck supple. Thyromegaly present.  She has symmetric thyromegaly with no palpable nodules.  Cardiovascular: Normal rate, regular rhythm and normal heart sounds.   Pulmonary/Chest: Effort normal and breath sounds normal. She has no wheezes.  Lymphadenopathy:    She has no cervical adenopathy.  Neurological: She is alert and oriented to person, place, and time.  Skin: Skin is warm and dry.  Psychiatric: She has a normal mood and affect.          Assessment & Plan:  Acute sinusitis-will treat with Augmentin. Call better in one week. Continues to Medicare. Avoid decongestants. Next  Thyromegaly-will check thyroid labs as well as schedule for an ultrasound. She denies any family history of thyroid problems and has never had a personal problem with her thyroid gland. She denies any skin or hair changes or recent weight changes.

## 2012-12-13 NOTE — Patient Instructions (Signed)

## 2012-12-14 LAB — TSH: TSH: 4.584 u[IU]/mL — ABNORMAL HIGH (ref 0.350–4.500)

## 2012-12-22 ENCOUNTER — Ambulatory Visit (HOSPITAL_BASED_OUTPATIENT_CLINIC_OR_DEPARTMENT_OTHER): Payer: BC Managed Care – PPO

## 2013-03-10 ENCOUNTER — Other Ambulatory Visit: Payer: Self-pay

## 2013-09-05 DIAGNOSIS — E041 Nontoxic single thyroid nodule: Secondary | ICD-10-CM | POA: Insufficient documentation

## 2014-01-31 ENCOUNTER — Encounter: Payer: Self-pay | Admitting: Family Medicine

## 2014-01-31 ENCOUNTER — Ambulatory Visit (INDEPENDENT_AMBULATORY_CARE_PROVIDER_SITE_OTHER): Payer: BC Managed Care – PPO | Admitting: Family Medicine

## 2014-01-31 VITALS — BP 125/72 | HR 89 | Wt 112.0 lb

## 2014-01-31 DIAGNOSIS — E049 Nontoxic goiter, unspecified: Secondary | ICD-10-CM

## 2014-01-31 DIAGNOSIS — G43909 Migraine, unspecified, not intractable, without status migrainosus: Secondary | ICD-10-CM

## 2014-01-31 MED ORDER — FROVATRIPTAN SUCCINATE 2.5 MG PO TABS: ORAL_TABLET | ORAL | Status: DC

## 2014-01-31 NOTE — Progress Notes (Signed)
   Subjective:    Patient ID: Tina Pearson, female    DOB: 1981/03/10, 33 y.o.   MRN: [id number]  Migraine    Here to f/u on her migraines.  In the last 2 months has had 4 HA ( 2 per month on average).  Using Frova and works well.works about 90 5 of time.  Sleeping well.    See Dr. Morrison Oldlambeth for her thyroid for post partum Hypothyroid. Was on levothyroixine for almost a year.  She is now off of medication but still has a goiter. She's not had any changes in size of the gland. No difficulty swallowing. She's not sure but she might plan on getting pregnant again in the future.   Review of Systems     Objective:   Physical Exam  Constitutional: She is oriented to person, place, and time. She appears well-developed and well-nourished.  HENT:  Head: Normocephalic and atraumatic.  Neck: Neck supple. Thyromegaly present.  Cardiovascular: Normal rate, regular rhythm and normal heart sounds.   Pulmonary/Chest: Effort normal and breath sounds normal.  Lymphadenopathy:    She has no cervical adenopathy.  Neurological: She is alert and oriented to person, place, and time.  Skin: Skin is warm and dry.  Psychiatric: She has a normal mood and affect. Her behavior is normal.          Assessment & Plan:  Will get flu shot at work.    Migraine - overall symptoms are well controlled. She's getting 2 migraines per month on average. She says this is a significant improvement from previous which was 5-6 migraines per month. There for about works consistently 90% of the time. Refill sent to the pharmacy for one year. She starts to notice increasing frequency of migraines and encouraged her come back in  Postpartum thyroiditis-follows with Dr. Morrison OldLambeth. Still has a goiter on exam today.

## 2014-03-29 ENCOUNTER — Ambulatory Visit (INDEPENDENT_AMBULATORY_CARE_PROVIDER_SITE_OTHER): Payer: BC Managed Care – PPO | Admitting: Physician Assistant

## 2014-03-29 ENCOUNTER — Encounter: Payer: Self-pay | Admitting: Physician Assistant

## 2014-03-29 VITALS — BP 125/86 | HR 66 | Temp 97.8°F | Wt 113.0 lb

## 2014-03-29 DIAGNOSIS — L600 Ingrowing nail: Secondary | ICD-10-CM | POA: Diagnosis not present

## 2014-03-29 MED ORDER — TRAMADOL HCL 50 MG PO TABS: 50.0000 mg | ORAL_TABLET | Freq: Three times a day (TID) | ORAL | Status: DC | PRN

## 2014-03-29 MED ORDER — DOXYCYCLINE HYCLATE 100 MG PO TABS: 100.0000 mg | ORAL_TABLET | Freq: Two times a day (BID) | ORAL | Status: DC

## 2014-03-29 NOTE — Patient Instructions (Signed)
Infected Ingrown Toenail °An infected ingrown toenail occurs when the nail edge grows into the skin and bacteria invade the area. Symptoms include pain, tenderness, swelling, and pus drainage from the edge of the nail. Poorly fitting shoes, minor injuries, and improper cutting of the toenail may also contribute to the problem. You should cut your toenails squarely instead of rounding the edges. Do not cut them too short. Avoid tight or pointed toe shoes. Sometimes the ingrown portion of the nail must be removed. If your toenail is removed, it can take 3-4 months for it to re-grow. °HOME CARE INSTRUCTIONS  °· Soak your infected toe in warm water for 20-30 minutes, 2 to 3 times a day. °· Packing or dressings applied to the area should be changed daily. °· Take medicine as directed and finish them. °· Reduce activities and keep your foot elevated when able to reduce swelling and discomfort. Do this until the infection gets better. °· Wear sandals or go barefoot as much as possible while the infected area is sensitive. °· See your caregiver for follow-up care in 2-3 days if the infection is not better. °SEEK MEDICAL CARE IF:  °Your toe is becoming more red, swollen or painful. °MAKE SURE YOU:  °· Understand these instructions. °· Will watch your condition. °· Will get help right away if you are not doing well or get worse. °Document Released: 05/29/2004 Document Revised: 07/14/2011 Document Reviewed: 04/17/2008 °ExitCare® Patient Information ©2015 [employer]. This information is not intended to replace advice given to you by your health care provider. Make sure you discuss any questions you have with your health care provider. ° °

## 2014-04-02 NOTE — Progress Notes (Signed)
   Subjective:    Patient ID: Tina FitchLynn Terhune, female    DOB: November 29, 1980, 33 y.o.   MRN: [id number]  HPI Pt presents to the clinic with ingrown left toenail for the past week or so. For last 3 days has been very swollen, tender and getting pus out. She has never had to have toenail removed before. No fever, chills.    Review of Systems  All other systems reviewed and are negative.      Objective:   Physical Exam  Constitutional: She appears well-developed and well-nourished.  Skin:  Swollen tender with active pus left medial side great toenail.           Assessment & Plan:  Infected and ingrown left great toenail- due to infection opted to treat infection first and come back for partial toenail removal. Discussed loose fitting shoes, warm water soaks and given doxy for 10 days. If not improving or worsening can come in sooner for removal.

## 2014-09-19 ENCOUNTER — Ambulatory Visit: Payer: Self-pay | Admitting: Family Medicine

## 2014-10-06 LAB — LIPID PANEL
Cholesterol: 170 mg/dL (ref 0–200)
HDL: 55 mg/dL (ref 35–70)
TC/HDL: 3.1

## 2014-10-06 LAB — BASIC METABOLIC PANEL: Glucose: 79 mg/dL

## 2014-10-25 ENCOUNTER — Telehealth: Payer: Self-pay | Admitting: *Deleted

## 2014-10-25 ENCOUNTER — Ambulatory Visit (INDEPENDENT_AMBULATORY_CARE_PROVIDER_SITE_OTHER): Payer: BLUE CROSS/BLUE SHIELD | Admitting: Family Medicine

## 2014-10-25 ENCOUNTER — Encounter: Payer: Self-pay | Admitting: Family Medicine

## 2014-10-25 VITALS — BP 128/86 | HR 74 | Wt 113.0 lb

## 2014-10-25 DIAGNOSIS — I8393 Asymptomatic varicose veins of bilateral lower extremities: Secondary | ICD-10-CM | POA: Diagnosis not present

## 2014-10-25 DIAGNOSIS — I8390 Asymptomatic varicose veins of unspecified lower extremity: Secondary | ICD-10-CM

## 2014-10-25 MED ORDER — AMBULATORY NON FORMULARY MEDICATION
Status: DC
Start: 2014-10-25 — End: 2015-05-29

## 2014-10-25 NOTE — Telephone Encounter (Signed)
Pt called and left a message wanting to know if there was anywhere else that she could be referred to that would have a shorter wait time to get in. She states she will not be able to get in until end of aug beginning of sept

## 2014-10-25 NOTE — Progress Notes (Signed)
CC: Tina Pearson is a 34 y.o. female is here for check veins in leg   Subjective: HPI:  Swollen veins on the right leg that she began to see a few weeks ago. She is worried that she might have a blood clot. The veins are noticeable after a few seconds of standing up and most noticeable while walking. They are painless and do not cause her any discomfort other than visually they disturb her. She denies any swelling elsewhere beyond these veins. Symptoms have not been getting better or worse since onset. They're completely absent when sitting or when her legs are up. She denies any chest pain shortness of breath irregular heartbeat nor trauma to the right lower extremity.   Review Of Systems Outlined In HPI  Past Medical History  Diagnosis Date  . Migraines   . Hyperlipidemia   . Post partum thyroiditis     Dr. Morrison Old    Past Surgical History  Procedure Laterality Date  . Wisdome teeth     Family History  Problem Relation Age of Onset  . Heart attack Father   . Hyperlipidemia Father   . Heart attack Paternal Grandfather   . Hyperlipidemia Mother     History   Social History  . Marital Status: Married    Spouse Name: N/A  . Number of Children: N/A  . Years of Education: N/A   Occupational History  . Not on file.   Social History Main Topics  . Smoking status: Never Smoker   . Smokeless tobacco: Not on file  . Alcohol Use: Not on file     Comment: rare  . Drug Use: Not on file  . Sexual Activity: Not on file     Comment: marketing coordinator, runs 2 X week, eats healthy, married, no kids.   Other Topics Concern  . Not on file   Social History Narrative     Objective: BP 128/86 mmHg  Pulse 74  Wt 113 lb (51.256 kg)  Vital signs reviewed. General: Alert and Oriented, No Acute Distress HEENT: Pupils equal, round, reactive to light. Conjunctivae clear.  External ears unremarkable.  Moist mucous membranes. Lungs: Clear and comfortable work of breathing, speaking  in full sentences without accessory muscle use. Cardiac: Regular rate and rhythm.  Neuro: CN II-XII grossly intact, gait normal. Extremities: No peripheral edema.  Strong peripheral pulses. Mild to moderate varicose veins on the right anterior shin absent with elevation of leg most noticeable when standing.  Mental Status: No depression, anxiety, nor agitation. Logical though process. Skin: Warm and dry. No ulceration on the right lower extremity.   Assessment & Plan: Tina Pearson was seen today for check veins in leg.  Diagnoses and all orders for this visit:  Varicose vein of leg Orders: -     Ambulatory referral to Vascular Surgery -     AMBULATORY NON FORMULARY MEDICATION; Knee high compression stockings at a pressure of 20-51mmHg to be worn daily.  Please fit to size.   Discussed pathophysiology of varicose veins and that if they're painless a vascular specialist will rarely pursue treatment given that risks of surgery outweighed the benefit with lack of pain. Discussed the possibility of reducing progression to the point where they become painful or more visually disturbing by wearing compression stockings on a daily basis. I given her prescription of this and I offered the opportunity of getting a second opinion from a vascular specialist which she would like to pursue.  Return if symptoms worsen or  fail to improve.

## 2014-10-30 ENCOUNTER — Telehealth: Payer: Self-pay | Admitting: *Deleted

## 2014-10-30 NOTE — Telephone Encounter (Signed)
Ov notes faxed.[name], [place]a Ferrucci [place]

## 2014-11-07 NOTE — Telephone Encounter (Signed)
[place]

## 2014-11-07 NOTE — Telephone Encounter (Signed)
I [place] to [place] with [facility] but just like [facility] they can not get her in until the end of August. I tried to call patient to let her know but got her voicemail I will try to call again tomorrow. - CF

## 2014-11-08 NOTE — Telephone Encounter (Signed)
I talked to c[place] and Ms. Tina Pearson has an appointment on 7/21 at 2:00 pm with Dr. Linward HeadlandScott Baker. I have faxed the referral along with ov notes and insurance to them at [phone]. The phone number is [phone]. - CF

## 2014-11-09 ENCOUNTER — Other Ambulatory Visit: Payer: Self-pay | Admitting: *Deleted

## 2014-11-09 MED ORDER — FROVATRIPTAN SUCCINATE 2.5 MG PO TABS: ORAL_TABLET | ORAL | Status: DC

## 2015-01-04 HISTORY — PX: VEIN SURGERY: SHX48

## 2015-01-16 HISTORY — PX: OTHER SURGICAL HISTORY: SHX169

## 2015-02-25 ENCOUNTER — Other Ambulatory Visit: Payer: Self-pay | Admitting: Family Medicine

## 2015-05-28 ENCOUNTER — Other Ambulatory Visit: Payer: Self-pay | Admitting: Family Medicine

## 2015-05-28 MED ORDER — FROVATRIPTAN SUCCINATE 2.5 MG PO TABS: 2.5000 mg | ORAL_TABLET | ORAL | Status: DC | PRN

## 2015-05-29 ENCOUNTER — Ambulatory Visit (INDEPENDENT_AMBULATORY_CARE_PROVIDER_SITE_OTHER): Payer: BLUE CROSS/BLUE SHIELD | Admitting: Family Medicine

## 2015-05-29 ENCOUNTER — Encounter: Payer: Self-pay | Admitting: Family Medicine

## 2015-05-29 VITALS — BP 119/73 | HR 75 | Wt 108.0 lb

## 2015-05-29 DIAGNOSIS — Z8639 Personal history of other endocrine, nutritional and metabolic disease: Secondary | ICD-10-CM | POA: Diagnosis not present

## 2015-05-29 DIAGNOSIS — G43909 Migraine, unspecified, not intractable, without status migrainosus: Secondary | ICD-10-CM | POA: Diagnosis not present

## 2015-05-29 DIAGNOSIS — E049 Nontoxic goiter, unspecified: Secondary | ICD-10-CM

## 2015-05-29 MED ORDER — FROVATRIPTAN SUCCINATE 2.5 MG PO TABS: 2.5000 mg | ORAL_TABLET | ORAL | Status: DC | PRN

## 2015-05-29 NOTE — Progress Notes (Signed)
   Subjective:    Patient ID: Tina Pearson, female    DOB: [birth year], 35 y.o.   MRN: [id number]  HPI Migraine HA- getting about 1 per month around her and menstrual cycle.  She is dong well on the Frova.  Needs refill on medication.  She takes the Prevacid does typically work most of the time. She says that she lays down and goes to sleep then usually she can wake up headache free the next day. If she doesn't get the medication and time that will last 1-2 days. She is otherwise otherwise doing well. No other significant triggers right now besides her menstrual cycle.  Postpartum thyroiditis-her thyroid levels are back to normal. She is now following yearly with Dr. Morrison Old.     Review of Systems     Objective:   Physical Exam  Constitutional: She is oriented to person, place, and time. She appears well-developed and well-nourished.  HENT:  Head: Normocephalic and atraumatic.  + goiter.   Cardiovascular: Normal rate, regular rhythm and normal heart sounds.   Pulmonary/Chest: Effort normal and breath sounds normal.  Neurological: She is alert and oriented to person, place, and time.  Skin: Skin is warm and dry.  Psychiatric: She has a normal mood and affect. Her behavior is normal.          Assessment & Plan:  Migraine headaches-overall well controlled. Refilled her further for 1 year.Follow-up in one year.her that she is due for her Pap smear and encouraged her to schedule. She says her last one was last year at [facility]. Will call for copy.  Postpartum thyroiditis-resolved but monitoring yearly.

## 2015-05-30 ENCOUNTER — Telehealth: Payer: Self-pay | Admitting: Family Medicine

## 2015-05-30 NOTE — Telephone Encounter (Signed)
Left message to call for results

## 2015-05-30 NOTE — Telephone Encounter (Signed)
Please call patient: I did receive her lab results from work. Her total cholesterol and glucose look great. The HDL is good to. Regular exercise will help bring that up area blood pressure looks fantastic. And BMI is great.

## 2015-05-30 NOTE — Telephone Encounter (Signed)
Pt called, aware of results.

## 2015-05-31 ENCOUNTER — Encounter: Payer: Self-pay | Admitting: Family Medicine

## 2015-06-11 ENCOUNTER — Encounter: Payer: Self-pay | Admitting: Family Medicine

## 2015-06-11 ENCOUNTER — Ambulatory Visit (INDEPENDENT_AMBULATORY_CARE_PROVIDER_SITE_OTHER): Payer: BLUE CROSS/BLUE SHIELD | Admitting: Family Medicine

## 2015-06-11 VITALS — BP 112/69 | HR 94 | Temp 98.2°F | Wt 112.7 lb

## 2015-06-11 DIAGNOSIS — J019 Acute sinusitis, unspecified: Secondary | ICD-10-CM | POA: Diagnosis not present

## 2015-06-11 DIAGNOSIS — J209 Acute bronchitis, unspecified: Secondary | ICD-10-CM | POA: Diagnosis not present

## 2015-06-11 MED ORDER — AZITHROMYCIN 250 MG PO TABS: ORAL_TABLET | ORAL | Status: AC

## 2015-06-11 MED ORDER — HYDROCODONE-HOMATROPINE 5-1.5 MG/5ML PO SYRP: 5.0000 mL | ORAL_SOLUTION | Freq: Every evening | ORAL | Status: DC | PRN

## 2015-06-11 NOTE — Progress Notes (Signed)
   Subjective:    Patient ID: Tina Pearson, female    DOB: 1980-06-05, 35 y.o.   MRN: [id number]  HPI Cough x 2 wks taking nyquil and mucinex. productive green mucous. Se feels like she is getting worse.  No SOB or wheezing.  + runny nose. No facial pain or pressure.  Says now getting "chunks" of yellow/green sputum.  + fatigue. No fever, chills or sweats.  No chest pain but with cough.     Review of Systems     Objective:   Physical Exam  Constitutional: She is oriented to person, place, and time. She appears well-developed and well-nourished.  HENT:  Head: Normocephalic and atraumatic.  Right Ear: External ear normal.  Left Ear: External ear normal.  Nose: Nose normal.  Mouth/Throat: Oropharynx is clear and moist.  TMs and canals are clear.   Eyes: Conjunctivae and EOM are normal. Pupils are equal, round, and reactive to light.  Neck: Neck supple. Thyromegaly present.  + goiter  Cardiovascular: Normal rate, regular rhythm and normal heart sounds.   Pulmonary/Chest: Effort normal and breath sounds normal. She has no wheezes.  Lymphadenopathy:    She has no cervical adenopathy.  Neurological: She is alert and oriented to person, place, and time.  Skin: Skin is warm and dry.  Psychiatric: She has a normal mood and affect.          Assessment & Plan:  Acute sinusitis/bronchitis - will treat with azithromycin. Also given prescription for cough and cold medication. If she has any problems please let me know where she's not better in one week and please let me know. Call sooner if becomes short of breath X parents his chest pain or tightness or develops new onset fever.

## 2015-06-18 ENCOUNTER — Other Ambulatory Visit (HOSPITAL_COMMUNITY)
Admission: RE | Admit: 2015-06-18 | Discharge: 2015-06-18 | Disposition: A | Discharge status: Home / Self Care | Payer: BLUE CROSS/BLUE SHIELD | Source: Ambulatory Visit | Attending: Family Medicine | Admitting: Family Medicine

## 2015-06-18 ENCOUNTER — Encounter: Payer: Self-pay | Admitting: Family Medicine

## 2015-06-18 ENCOUNTER — Ambulatory Visit (INDEPENDENT_AMBULATORY_CARE_PROVIDER_SITE_OTHER): Payer: BLUE CROSS/BLUE SHIELD | Admitting: Family Medicine

## 2015-06-18 VITALS — BP 116/84 | HR 81 | Wt 111.2 lb

## 2015-06-18 DIAGNOSIS — Z1151 Encounter for screening for human papillomavirus (HPV): Secondary | ICD-10-CM | POA: Diagnosis present

## 2015-06-18 DIAGNOSIS — N912 Amenorrhea, unspecified: Secondary | ICD-10-CM

## 2015-06-18 DIAGNOSIS — Z0000 Encounter for general adult medical examination without abnormal findings: Secondary | ICD-10-CM | POA: Diagnosis not present

## 2015-06-18 DIAGNOSIS — Z114 Encounter for screening for human immunodeficiency virus [HIV]: Secondary | ICD-10-CM

## 2015-06-18 DIAGNOSIS — Z01419 Encounter for gynecological examination (general) (routine) without abnormal findings: Secondary | ICD-10-CM | POA: Diagnosis present

## 2015-06-18 DIAGNOSIS — Z8639 Personal history of other endocrine, nutritional and metabolic disease: Secondary | ICD-10-CM | POA: Diagnosis not present

## 2015-06-18 LAB — POCT URINE PREGNANCY: PREG TEST UR: NEGATIVE

## 2015-06-18 NOTE — Progress Notes (Signed)
  Subjective:     Tina Pearson is a 35 y.o. female and is here for a comprehensive physical exam. The patient reports no problems.   Social History   Social History  . Marital Status: Married    Spouse Name: N/A  . Number of Children: 1  . Years of Education: N/A   Occupational History  . Marketing      Social History Main Topics  . Smoking status: Never Smoker   . Smokeless tobacco: Not on file  . Alcohol Use: Not on file     Comment: rare  . Drug Use: Not on file  . Sexual Activity: Not on file     Comment: marketing coordinator, runs 2 X week, eats healthy, married, no kids.   Other Topics Concern  . Not on file   Social History Narrative   Some exercise. Drinks caffeine daily.    Health Maintenance  Topic Date Due  . HIV Screening  07/23/1995  . PAP SMEAR  05/24/2013  . INFLUENZA VACCINE  12/04/2015  . TETANUS/TDAP  05/05/2022    The following portions of the patient's history were reviewed and updated as appropriate: allergies, current medications, past family history, past medical history, past social history, past surgical history and problem list.  Review of Systems Pertinent items noted in HPI and remainder of comprehensive ROS otherwise negative.   Objective:    BP 116/84 mmHg  Pulse 81  Wt 111 lb 3.2 oz (50.44 kg)  SpO2 100%  LMP 05/21/2015 General appearance: alert, cooperative and appears stated age Head: Normocephalic, without obvious abnormality, atraumatic Eyes: conj clear, EOMI, pEERLA Ears: normal TM's and external ear canals both ears Nose: Nares normal. Septum midline. Mucosa normal. No drainage or sinus tenderness. Throat: lips, mucosa, and tongue normal; teeth and gums normal Neck: no adenopathy, no carotid bruit, no JVD, supple, symmetrical, trachea midline and thyroid not enlarged, symmetric, no tenderness/mass/nodules Back: symmetric, no curvature. ROM normal. No CVA tenderness. Lungs: clear to auscultation bilaterally Breasts: normal  appearance, no masses or tenderness Heart: regular rate and rhythm, S1, S2 normal, no murmur, click, rub or gallop Abdomen: soft, non-tender; bowel sounds normal; no masses,  no organomegaly Pelvic: cervix normal in appearance, external genitalia normal, no adnexal masses or tenderness, no cervical motion tenderness, rectovaginal septum normal, uterus normal size, shape, and consistency and vagina normal without discharge Extremities: extremities normal, atraumatic, no cyanosis or edema Pulses: 2+ and symmetric Skin: Skin color, texture, turgor normal. No rashes or lesions Lymph nodes: Cervical, supraclavicular, and axillary nodes normal. Neurologic: Alert and oriented X 3, normal strength and tone. Normal symmetric reflexes. Normal coordination and gait    Assessment:    Healthy female exam.      Plan:     See After Visit Summary for Counseling Recommendations  Keep up a regular exercise program and make sure you are eating a healthy diet Try to eat 4 servings of dairy a day, or if you are lactose intolerant take a calcium with vitamin D daily.  Your vaccines are up to date.

## 2015-06-19 LAB — COMPLETE METABOLIC PANEL WITH GFR
ALT: 10 U/L (ref 6–29)
AST: 14 U/L (ref 10–30)
Albumin: 4.3 g/dL (ref 3.6–5.1)
Alkaline Phosphatase: 78 U/L (ref 33–115)
BUN: 12 mg/dL (ref 7–25)
CALCIUM: 9.5 mg/dL (ref 8.6–10.2)
CHLORIDE: 102 mmol/L (ref 98–110)
CO2: 26 mmol/L (ref 20–31)
CREATININE: 0.89 mg/dL (ref 0.50–1.10)
GFR, Est African American: >89 mL/min (ref >=60)
GFR, Est Non African American: 85 mL/min (ref >=60)
GLUCOSE: 77 mg/dL (ref 65–99)
Potassium: 4.2 mmol/L (ref 3.5–5.3)
Sodium: 135 mmol/L (ref 135–146)
Total Bilirubin: 0.7 mg/dL (ref 0.2–1.2)
Total Protein: 7.3 g/dL (ref 6.1–8.1)

## 2015-06-19 LAB — LIPID PANEL
CHOL/HDL RATIO: 5.3 ratio — AB (ref <=5.0)
CHOLESTEROL: 190 mg/dL (ref 125–200)
HDL: 36 mg/dL — AB (ref >=46)
LDL CALC: 134 mg/dL — AB (ref <130)
Triglycerides: 100 mg/dL (ref <150)
VLDL: 20 mg/dL (ref <30)

## 2015-06-19 LAB — TSH: TSH: 2.18 mIU/L

## 2015-06-20 LAB — CYTOLOGY - PAP

## 2015-06-20 LAB — HIV ANTIBODY (ROUTINE TESTING W REFLEX): HIV 1&2 Ab, 4th Generation: NONREACTIVE

## 2015-07-24 ENCOUNTER — Ambulatory Visit (INDEPENDENT_AMBULATORY_CARE_PROVIDER_SITE_OTHER): Payer: BLUE CROSS/BLUE SHIELD | Admitting: Osteopathic Medicine

## 2015-07-24 VITALS — BP 113/77 | HR 105 | Temp 98.4°F | Ht 63.0 in | Wt 111.0 lb

## 2015-07-24 DIAGNOSIS — Z331 Pregnant state, incidental: Secondary | ICD-10-CM

## 2015-07-24 DIAGNOSIS — N926 Irregular menstruation, unspecified: Secondary | ICD-10-CM | POA: Diagnosis not present

## 2015-07-24 DIAGNOSIS — J019 Acute sinusitis, unspecified: Secondary | ICD-10-CM

## 2015-07-24 DIAGNOSIS — B9789 Other viral agents as the cause of diseases classified elsewhere: Principal | ICD-10-CM

## 2015-07-24 DIAGNOSIS — J069 Acute upper respiratory infection, unspecified: Secondary | ICD-10-CM

## 2015-07-24 DIAGNOSIS — Z3490 Encounter for supervision of normal pregnancy, unspecified, unspecified trimester: Secondary | ICD-10-CM

## 2015-07-24 LAB — POCT URINE PREGNANCY: Preg Test, Ur: POSITIVE — AB

## 2015-07-24 MED ORDER — GUAIFENESIN 200 MG PO TABS: 200.0000 mg | ORAL_TABLET | ORAL | Status: DC | PRN

## 2015-07-24 MED ORDER — AMOXICILLIN-POT CLAVULANATE 875-125 MG PO TABS: 1.0000 | ORAL_TABLET | Freq: Two times a day (BID) | ORAL | Status: DC

## 2015-07-24 MED ORDER — IPRATROPIUM BROMIDE 0.03 % NA SOLN: 2.0000 | Freq: Three times a day (TID) | NASAL | Status: DC

## 2015-07-24 NOTE — Progress Notes (Signed)
HPI: Katy FitchLynn Mitrano is a 35 y.o. female who [name]  today for chief complaint of:  Chief Complaint  Patient presents with  . Cough   COUGH/COLD . Quality: cough started first, congestion now and over the weekend . Assoc signs/symptoms: see ROS . Duration: 7 days . Modifying factors: has tried the following OTC/Rx medications: tried half robitussin  . Context:  Was coming in Thursday to confirm pregnancy test   PREGNANCY She and husband have been trying to get pregnant, home test (+), requests confirmatory test here and [facility]   Past medical, social and family history reviewed. Current medications and allergies reviewed.     Review of Systems: CONSTITUTIONAL: yes fever/chills, subjective  HEAD/EYES/EARS/NOSE/THROAT: yes headache, no vision change or hearing change, yes sore throat CARDIAC: No chest pain/pressure/palpitations RESPIRATORY: yes cough, no shortness of breath GASTROINTESTINAL: no nausea, no vomiting, no abdominal pain, no diarrhea MUSCULOSKELETAL: yes myalgia/arthralgia   Exam:  BP 113/77 mmHg  Pulse 105  Temp(Src) 98.4 F (36.9 C) (Oral)  Ht 5\' 3"  (1.6 m)  Wt 111 lb (50.349 kg)  BMI 19.67 kg/m2 Constitutional: VSS, see above. General Appearance: alert, well-developed, well-nourished, NAD Eyes: Normal lids and conjunctive, non-icteric sclera Ears, Nose, Mouth, Throat: Normal external inspection ears/nares/mouth/lips/gums, normal TM, MMM;       posterior pharynx with erythema, without exudate Neck: (+) goiter, trachea midline. normal lymph nodes Respiratory: Normal respiratory effort. No wheeze/rhonchi/rales Cardiovascular: S1/S2 normal, no murmur/rub/gallop auscultated. RRR.   Results for orders placed or performed in visit on 07/24/15 (from the past 72 hour(s))  POCT urine pregnancy     Status: Abnormal   Collection Time: 07/24/15  8:53 AM  Result Value Ref Range   Preg Test, Ur Positive (A)  Negative    ASSESSMENT/PLAN: Fill abx if no better by the weekend, list of ok OTC meds given. [facility].   Viral URI with cough - Plan: guaiFENesin 200 MG tablet, ipratropium (ATROVENT) 0.03 % nasal spray  Acute sinusitis, recurrence not specified, unspecified location - Plan: amoxicillin-clavulanate (AUGMENTIN) 875-125 MG tablet  Missed period - Plan: POCT urine pregnancy  Pregnancy - Plan: Ambulatory referral to Obstetrics / Gynecology   Return if symptoms worsen or fail to improve.

## 2015-07-24 NOTE — Patient Instructions (Addendum)
For cough/cold - ok to use in pregnancy...  Tylenol (NOT Ibuprofen or other NSAID)  Guaifenesin (in certain formulations of Robitussin and Mucinex)  Benadryl and other antihistamines (DO NOT take -D formulations of these, DO NOT use Sudafed)  Most nasal sprays - ask pharmacist if any concerns  Honey & Lemon - as much as you want!   Lozenges with Menthol or Benzocaine  You've been given a printed prescription for an antibiotic to take for bacterial sinusitis - Fill this in the next few days if you are feeling worse or not getting better by the weekend. Most bacterial sinus infections develop due to inflammation in the sinuses from a viral illness, which then causes lots of mucus buildup and lack of air flow which is perfect environment for bacterial growth. When we treat the inflammation and mucus, bacterial infections are rare.

## 2015-07-26 ENCOUNTER — Ambulatory Visit: Payer: BLUE CROSS/BLUE SHIELD | Admitting: Family Medicine

## 2015-08-31 ENCOUNTER — Encounter: Payer: Self-pay | Admitting: Family Medicine

## 2015-11-07 ENCOUNTER — Ambulatory Visit (INDEPENDENT_AMBULATORY_CARE_PROVIDER_SITE_OTHER): Payer: BLUE CROSS/BLUE SHIELD | Admitting: Family Medicine

## 2015-11-07 ENCOUNTER — Encounter: Payer: Self-pay | Admitting: Family Medicine

## 2015-11-07 VITALS — BP 126/84 | HR 79 | Wt 111.0 lb

## 2015-11-07 DIAGNOSIS — T63441A Toxic effect of venom of bees, accidental (unintentional), initial encounter: Secondary | ICD-10-CM | POA: Diagnosis not present

## 2015-11-07 MED ORDER — METHYLPREDNISOLONE SODIUM SUCC 125 MG IJ SOLR
125.0000 mg | Freq: Once | INTRAMUSCULAR | Status: AC
  Administered 2015-11-07: 125 mg via INTRAMUSCULAR

## 2015-11-07 MED ORDER — PREDNISONE 20 MG PO TABS: ORAL_TABLET | ORAL | Status: AC

## 2015-11-07 NOTE — Addendum Note (Signed)
Addended by: Thom ChimesHENRY, Hermen Mario M on: 11/07/2015 10:36 AM   Modules accepted: Orders

## 2015-11-07 NOTE — Progress Notes (Signed)
CC: Tina Pearson is a 35 y.o. female is here for Insect Bite   Subjective: HPI:  Yesterday around 10 the morning she was gardening and was stung by a bee. She was able to get the stinger out within a few minutes. She went to bed last night and was somewhat tender however woke this morning with redness and a throbbing sensation in the right hand. It's localized to the dorsal surface of the hand and slowly spreading. It also itchy. No benefit from Benadryl. Symptoms were bad enough to where it woke her up in the middle the night. She denies any fever, chills, nausea, flushing, shortness of breath or wheezing. She tells them she feels great other than the bee sting.   Review Of Systems Outlined In HPI  Past Medical History  Diagnosis Date  . Migraines   . Hyperlipidemia   . Post partum thyroiditis     Dr. Morrison [facility]eth    Past Surgical History  Procedure Laterality Date  . Wisdom tooth extraction    . Vein surgery Right 01/2015    [facility].    Family History  Problem Relation Age of Onset  . Heart attack Father   . Hyperlipidemia Father   . Heart attack Paternal Grandfather   . Hyperlipidemia Mother   . Stroke Paternal Grandfather     Social History   Social History  . Marital Status: Married    Spouse Name: N/A  . Number of Children: 1  . Years of Education: N/A   Occupational History  . Marketing      Social History Main Topics  . Smoking status: Never Smoker   . Smokeless tobacco: Not on file  . Alcohol Use: Not on file     Comment: rare  . Drug Use: Not on file  . Sexual Activity: Not on file     Comment: marketing coordinator, runs 2 X week, eats healthy, married, no kids.   Other Topics Concern  . Not on file   Social History Narrative   Some exercise. Drinks caffeine daily.      Objective: BP 126/84 mmHg  Pulse 79  Wt 111 lb (50.349 kg)  Vital signs reviewed. General: Alert and Oriented, No Acute Distress HEENT: Pupils equal, round, reactive  to light. Conjunctivae clear.  External ears unremarkable.  Moist mucous membranes. Lungs: Clear and comfortable work of breathing, speaking in full sentences without accessory muscle use. Cardiac: Regular rate and rhythm.  Neuro: CN II-XII grossly intact, gait normal. Extremities: No peripheral edema.  Strong peripheral pulses.  Mental Status: No depression, anxiety, nor agitation. Logical though process. Skin: Warm and dry. On the dorsal surface of the right hand from the base of the thumb to the knuckles of the index and middle finger there is mild edema and moderate erythema that blanches.  Assessment & Plan: Tina Pearson was seen today for insect bite.  Diagnoses and all orders for this visit:  Bee sting, accidental or unintentional, initial encounter -     predniSONE (DELTASONE) 20 MG tablet; Three tabs at once daily for five days.   Low suspicion for strep infection, most likely inflammatory reaction from an allergic reaction after bee sting. She'll get Solu-Medrol injection here today along with prednisone to take on a daily basis. I given her signs and symptoms to look out for that would be more suggestive of a cellulitis that needs an antibiotic.Signs and symptoms requring emergent/urgent reevaluation were discussed with the patient.  No Follow-up on  file.

## 2015-11-08 ENCOUNTER — Telehealth: Payer: Self-pay

## 2015-11-08 NOTE — Telephone Encounter (Signed)
Pt was seen yesterday morning for a bee sting and was given depo-medrol 80 mg here in office and prednisone was Rx. She took the prednisone at 11 am.  She reports that she could not sleep (prednisone side effect) last night and wants to know should something different be Rx. Please advise.

## 2015-11-08 NOTE — Telephone Encounter (Signed)
This is fairly typical with prednisone, my advice is to take it earlier in the morning. Take it when she first wakes up

## 2015-11-08 NOTE — Telephone Encounter (Signed)
Patient advised of recommendations.  

## 2016-05-04 DIAGNOSIS — G43909 Migraine, unspecified, not intractable, without status migrainosus: Secondary | ICD-10-CM | POA: Diagnosis not present

## 2016-06-23 DIAGNOSIS — R05 Cough: Secondary | ICD-10-CM | POA: Diagnosis not present

## 2016-06-23 DIAGNOSIS — Z20828 Contact with and (suspected) exposure to other viral communicable diseases: Secondary | ICD-10-CM | POA: Diagnosis not present

## 2016-06-23 DIAGNOSIS — R6889 Other general symptoms and signs: Secondary | ICD-10-CM | POA: Diagnosis not present

## 2016-06-24 ENCOUNTER — Ambulatory Visit: Payer: BLUE CROSS/BLUE SHIELD | Admitting: Family Medicine

## 2016-07-01 DIAGNOSIS — N938 Other specified abnormal uterine and vaginal bleeding: Secondary | ICD-10-CM | POA: Diagnosis not present

## 2016-07-01 DIAGNOSIS — Z3202 Encounter for pregnancy test, result negative: Secondary | ICD-10-CM | POA: Diagnosis not present

## 2016-07-24 ENCOUNTER — Encounter: Payer: BLUE CROSS/BLUE SHIELD | Admitting: Family Medicine

## 2016-07-29 ENCOUNTER — Telehealth: Payer: Self-pay | Admitting: Family Medicine

## 2016-07-29 DIAGNOSIS — Z0000 Encounter for general adult medical examination without abnormal findings: Secondary | ICD-10-CM

## 2016-07-29 NOTE — Telephone Encounter (Signed)
Patient came in today requesting labs before her physical scheduled for next week.

## 2016-07-30 ENCOUNTER — Other Ambulatory Visit: Payer: Self-pay | Admitting: Family Medicine

## 2016-07-30 DIAGNOSIS — Z0000 Encounter for general adult medical examination without abnormal findings: Secondary | ICD-10-CM | POA: Diagnosis not present

## 2016-07-30 LAB — LIPID PANEL W/REFLEX DIRECT LDL
Cholesterol: 176 mg/dL (ref <200)
HDL: 45 mg/dL — ABNORMAL LOW (ref >50)
LDL-CHOLESTEROL: 115 mg/dL — AB
Non-HDL Cholesterol (Calc): 131 mg/dL — ABNORMAL HIGH (ref <130)
TRIGLYCERIDES: 67 mg/dL (ref <150)
Total CHOL/HDL Ratio: 3.9 Ratio (ref <5.0)

## 2016-07-30 LAB — COMPLETE METABOLIC PANEL WITH GFR
ALT: 7 U/L (ref 6–29)
AST: 13 U/L (ref 10–30)
Albumin: 4.2 g/dL (ref 3.6–5.1)
Alkaline Phosphatase: 66 U/L (ref 33–115)
BUN: 12 mg/dL (ref 7–25)
CHLORIDE: 105 mmol/L (ref 98–110)
CO2: 25 mmol/L (ref 20–31)
Calcium: 9.3 mg/dL (ref 8.6–10.2)
Creat: 0.78 mg/dL (ref 0.50–1.10)
GFR, Est Non African American: >89 mL/min (ref >=60)
GLUCOSE: 85 mg/dL (ref 65–99)
Potassium: 4 mmol/L (ref 3.5–5.3)
SODIUM: 139 mmol/L (ref 135–146)
TOTAL PROTEIN: 7.2 g/dL (ref 6.1–8.1)
Total Bilirubin: 0.7 mg/dL (ref 0.2–1.2)

## 2016-07-30 LAB — TSH: TSH: 2.01 mIU/L

## 2016-07-31 ENCOUNTER — Encounter: Payer: BLUE CROSS/BLUE SHIELD | Admitting: Family Medicine

## 2016-07-31 ENCOUNTER — Ambulatory Visit (INDEPENDENT_AMBULATORY_CARE_PROVIDER_SITE_OTHER): Payer: BLUE CROSS/BLUE SHIELD | Admitting: Family Medicine

## 2016-07-31 ENCOUNTER — Encounter: Payer: Self-pay | Admitting: Family Medicine

## 2016-07-31 VITALS — BP 131/74 | HR 84 | Ht 63.0 in | Wt 116.0 lb

## 2016-07-31 DIAGNOSIS — N912 Amenorrhea, unspecified: Secondary | ICD-10-CM

## 2016-07-31 DIAGNOSIS — Z3A01 Less than 8 weeks gestation of pregnancy: Secondary | ICD-10-CM | POA: Diagnosis not present

## 2016-07-31 DIAGNOSIS — Z0000 Encounter for general adult medical examination without abnormal findings: Secondary | ICD-10-CM

## 2016-07-31 LAB — PROGESTERONE: Progesterone: 10.9 ng/mL

## 2016-07-31 LAB — POCT URINE PREGNANCY: PREG TEST UR: POSITIVE — AB

## 2016-07-31 LAB — HCG, QUANTITATIVE, PREGNANCY: hCG, Beta Chain, Quant, S: 71 m[IU]/mL — ABNORMAL HIGH

## 2016-07-31 NOTE — Telephone Encounter (Signed)
Notified lab, spoke with Tresa EndoKelly, added.

## 2016-07-31 NOTE — Patient Instructions (Addendum)
Estimated due date by LMP 04/01/2017   Keep up a regular exercise program and make sure you are eating a healthy diet Try to eat 4 servings of dairy a day, or if you are lactose intolerant take a calcium with vitamin D daily.  Your vaccines are up to date.     First Trimester of Pregnancy The first trimester of pregnancy is from week 1 until the end of week 13 (months 1 through 3). A week after a sperm fertilizes an egg, the egg will implant on the wall of the uterus. This embryo will begin to develop into a baby. Genes from you and your partner will form the baby. The female genes will determine whether the baby will be a boy or a girl. At 6-8 weeks, the eyes and face will be formed, and the heartbeat can be seen on ultrasound. At the end of 12 weeks, all the baby's organs will be formed. Now that you are pregnant, you will want to do everything you can to have a healthy baby. Two of the most important things are to get good prenatal care and to follow your health care provider's instructions. Prenatal care is all the medical care you receive before the baby's birth. This care will help prevent, find, and treat any problems during the pregnancy and childbirth. Body changes during your first trimester Your body goes through many changes during pregnancy. The changes vary from woman to woman.  You may gain or lose a couple of pounds at first.  You may feel sick to your stomach (nauseous) and you may throw up (vomit). If the vomiting is uncontrollable, call your health care provider.  You may tire easily.  You may develop headaches that can be relieved by medicines. All medicines should be approved by your health care provider.  You may urinate more often. Painful urination may mean you have a bladder infection.  You may develop heartburn as a result of your pregnancy.  You may develop constipation because certain hormones are causing the muscles that push stool through your intestines to slow  down.  You may develop hemorrhoids or swollen veins (varicose veins).  Your breasts may begin to grow larger and become tender. Your nipples may stick out more, and the tissue that surrounds them (areola) may become darker.  Your gums may bleed and may be sensitive to brushing and flossing.  Dark spots or blotches (chloasma, mask of pregnancy) may develop on your face. This will likely fade after the baby is born.  Your menstrual periods will stop.  You may have a loss of appetite.  You may develop cravings for certain kinds of food.  You may have changes in your emotions from day to day, such as being excited to be pregnant or being concerned that something may go wrong with the pregnancy and baby.  You may have more vivid and strange dreams.  You may have changes in your hair. These can include thickening of your hair, rapid growth, and changes in texture. Some women also have hair loss during or after pregnancy, or hair that feels dry or thin. Your hair will most likely return to normal after your baby is born. What to expect at prenatal visits During a routine prenatal visit:  You will be weighed to make sure you and the baby are growing normally.  Your blood pressure will be taken.  Your abdomen will be measured to track your baby's growth.  The fetal heartbeat will be listened  to between weeks 10 and 14 of your pregnancy.  Test results from any previous visits will be discussed. Your health care provider may ask you:  How you are feeling.  If you are feeling the baby move.  If you have had any abnormal symptoms, such as leaking fluid, bleeding, severe headaches, or abdominal cramping.  If you are using any tobacco products, including cigarettes, chewing tobacco, and electronic cigarettes.  If you have any questions. Other tests that may be performed during your first trimester include:  Blood tests to find your blood type and to check for the presence of any  previous infections. The tests will also be used to check for low iron levels (anemia) and protein on red blood cells (Rh antibodies). Depending on your risk factors, or if you previously had diabetes during pregnancy, you may have tests to check for high blood sugar that affects pregnant women (gestational diabetes).  Urine tests to check for infections, diabetes, or protein in the urine.  An ultrasound to confirm the proper growth and development of the baby.  Fetal screens for spinal cord problems (spina bifida) and Down syndrome.  HIV (human immunodeficiency virus) testing. Routine prenatal testing includes screening for HIV, unless you choose not to have this test.  You may need other tests to make sure you and the baby are doing well. Follow these instructions at home: Medicines   Follow your health care provider's instructions regarding medicine use. Specific medicines may be either safe or unsafe to take during pregnancy.  Take a prenatal vitamin that contains at least 600 micrograms (mcg) of folic acid.  If you develop constipation, try taking a stool softener if your health care provider approves. Eating and drinking   Eat a balanced diet that includes fresh fruits and vegetables, whole grains, good sources of protein such as meat, eggs, or tofu, and low-fat dairy. Your health care provider will help you determine the amount of weight gain that is right for you.  Avoid raw meat and uncooked cheese. These carry germs that can cause birth defects in the baby.  Eating four or five small meals rather than three large meals a day may help relieve nausea and vomiting. If you start to feel nauseous, eating a few soda crackers can be helpful. Drinking liquids between meals, instead of during meals, also seems to help ease nausea and vomiting.  Limit foods that are high in fat and processed sugars, such as fried and sweet foods.  To prevent constipation:  Eat foods that are high in  fiber, such as fresh fruits and vegetables, whole grains, and beans.  Drink enough fluid to keep your urine clear or pale yellow. Activity   Exercise only as directed by your health care provider. Most women can continue their usual exercise routine during pregnancy. Try to exercise for 30 minutes at least 5 days a week. Exercising will help you:  Control your weight.  Stay in shape.  Be prepared for labor and delivery.  Experiencing pain or cramping in the lower abdomen or lower back is a good sign that you should stop exercising. Check with your health care provider before continuing with normal exercises.  Try to avoid standing for long periods of time. Move your legs often if you must stand in one place for a long time.  Avoid heavy lifting.  Wear low-heeled shoes and practice good posture.  You may continue to have sex unless your health care provider tells you not to. Relieving  pain and discomfort   Wear a good support bra to relieve breast tenderness.  Take warm sitz baths to soothe any pain or discomfort caused by hemorrhoids. Use hemorrhoid cream if your health care provider approves.  Rest with your legs elevated if you have leg cramps or low back pain.  If you develop varicose veins in your legs, wear support hose. Elevate your feet for 15 minutes, 3-4 times a day. Limit salt in your diet. Prenatal care   Schedule your prenatal visits by the twelfth week of pregnancy. They are usually scheduled monthly at first, then more often in the last 2 months before delivery.  Write down your questions. Take them to your prenatal visits.  Keep all your prenatal visits as told by your health care provider. This is important. Safety   Wear your seat belt at all times when driving.  Make a list of emergency phone numbers, including numbers for family, friends, the hospital, and police and fire departments. General instructions   Ask your health care provider for a referral  to a local prenatal education class. Begin classes no later than the beginning of month 6 of your pregnancy.  Ask for help if you have counseling or nutritional needs during pregnancy. Your health care provider can offer advice or refer you to specialists for help with various needs.  Do not use hot tubs, steam rooms, or saunas.  Do not douche or use tampons or scented sanitary pads.  Do not cross your legs for long periods of time.  Avoid cat litter boxes and soil used by cats. These carry germs that can cause birth defects in the baby and possibly loss of the fetus by miscarriage or stillbirth.  Avoid all smoking, herbs, alcohol, and medicines not prescribed by your health care provider. Chemicals in these products affect the formation and growth of the baby.  Do not use any products that contain nicotine or tobacco, such as cigarettes and e-cigarettes. If you need help quitting, ask your health care provider. You may receive counseling support and other resources to help you quit.  Schedule a dentist appointment. At home, brush your teeth with a soft toothbrush and be gentle when you floss. Contact a health care provider if:  You have dizziness.  You have mild pelvic cramps, pelvic pressure, or nagging pain in the abdominal area.  You have persistent nausea, vomiting, or diarrhea.  You have a bad smelling vaginal discharge.  You have pain when you urinate.  You notice increased swelling in your face, hands, legs, or ankles.  You are exposed to fifth disease or chickenpox.  You are exposed to [place] measles (rubella) and have never had it. Get help right away if:  You have a fever.  You are leaking fluid from your vagina.  You have spotting or bleeding from your vagina.  You have severe abdominal cramping or pain.  You have rapid weight gain or loss.  You vomit blood or material that looks like coffee grounds.  You develop a severe headache.  You have shortness of  breath.  You have any kind of trauma, such as from a fall or a car accident. Summary  The first trimester of pregnancy is from week 1 until the end of week 13 (months 1 through 3).  Your body goes through many changes during pregnancy. The changes vary from woman to woman.  You will have routine prenatal visits. During those visits, your health care provider will examine you, discuss any  test results you may have, and talk with you about how you are feeling. This information is not intended to replace advice given to you by your health care provider. Make sure you discuss any questions you have with your health care provider. Document Released: 04/15/2001 Document Revised: 04/02/2016 Document Reviewed: 04/02/2016 Elsevier Interactive Patient Education  2017 [employer]  Folic Acid and Pregnancy What is folic acid? Folic acid is a B vitamin. Your body needs it to make new cells. Folic acid is also called folate. Folate is the form of the B vitamin that is found naturally in food. Folic acid is the artificial (synthetic) form of the B vitamin. Folic acid is added to certain foods (fortified foods) and is also available in dietary supplements such as prenatal vitamins. All women who may become pregnant or are planning to become pregnant need at least 400-800 mcg of folic acid daily. Most pregnant women need 600-800 mcg of folic acid per day, but some women need more. What are the benefits of taking folic acid during pregnancy? Taking folic acid during pregnancy helps to prevent abnormalities that can develop in an unborn baby's brain, spine, or spinal column (neural tube defects). These defects include:  Spina bifida. This is when the spinal column does not close completely during development, leaving the spinal cord exposed. This means the nerves that control leg movements and other bodily functions do not work. Spina bifida causes lifelong disabilities.  Anencephaly. Babies born with  anencephaly have an underdeveloped brain. They may have little or no brain matter, and they could also be missing parts of the skull. Neural tube defects occur in the first few months (first trimester) of pregnancy. If you are trying to get pregnant, make sure you get enough folic acid for at least one month before you start trying. It is important to get enough folic acid even if you are not trying to get pregnant, because some pregnancies are unplanned.If your pregnancy is unplanned, start taking folic acid as soon as you find out that you are pregnant. Folic acid can also help to prevent a drop in red blood cells that carry oxygen throughout the body (anemia). Anemia during pregnancy is associated with complications such as low birth weight and premature birth. What are the side effects of taking folic acid? Folic acid supplements may cause side effects, such as:  Diarrhea.  Abdominal cramping. Folic acid can also interact with certain medicines that are used to treat other conditions. These medicines include:  Methotrexate. This is an anticancer drug that is also used to treat some autoimmune diseases.  Antiepileptic medicine, such as phenytoin, carbamazepine, and valproate.  Sulfasalazine. This medicine is used to treat ulcerative colitis. How should I take folic acid during pregnancy? Even women who have a healthy, well-balanced diet may not get enough folate from food. Synthetic folic acid is easier for your body to use than the folate that is found naturally in certain foods. In addition to eating folate-rich foods, you can ensure that you get enough folic acid by taking prenatal vitamins and eating foods that are fortified with folic acid. Make sure your prenatal vitamin or B vitamin supplement contains 400-800 micrograms of folic acid. What foods should I eat? Folate is found naturally in:  Dark green leafy vegetables, such as spinach.  Asparagus.  Brussels sprouts.  Citrus  fruits and juices.  Nuts.  Beans.  Peas.  Eggs.  Meat, poultry, and seafood.  Soy products.  Whole grains. Folic acid  is often added to certain foods, including:  Bread.  Pasta.  White rice.  Breakfast cereal.  Flour.  Cornmeal. When should I seek medical care? Some women may need to take more than the recommended amount of folic acid. Talk with your health care provider about your folic acid needs if:  You had a baby with a neural tube defect and you want to get pregnant again.  You have a family history of spina bifida.  You have spina bifida and you want to get pregnant. Talk with your doctor about folic acid supplements if you are taking medicine for any of the following conditions:  Epilepsy.  Type 2 diabetes.  Autoimmune diseases, including rheumatoid arthritis, lupus, psoriasis, celiac disease, and inflammatory bowel disease.  Asthma.  Kidney disease.  Liver disease.  Sickle cell disease. This information is not intended to replace advice given to you by your health care provider. Make sure you discuss any questions you have with your health care provider. Document Released: 04/24/2003 Document Revised: 03/17/2016 Document Reviewed: 01/03/2015 Elsevier Interactive Patient Education  2017 [employer].

## 2016-07-31 NOTE — Progress Notes (Signed)
Subjective:     Tina Pearson is a 36 y.o. female and is here for a comprehensive physical exam. The patient reports problems - she thinks she might be pregnant. LMP 06/25/16.  trying to get pregnant.  Social History   Social History  . Marital status: Married    Spouse name: N/A  . Number of children: 1  . Years of education: N/A   Occupational History  . Marketing      Social History Main Topics  . Smoking status: Never Smoker  . Smokeless tobacco: Never Used  . Alcohol use Not on file     Comment: rare  . Drug use: Unknown  . Sexual activity: Not on file     Comment: Occupational psychologistmarketing coordinator, runs 2 X week, eats healthy, married, no kids.   Other Topics Concern  . Not on file   Social History Narrative   Some exercise. Drinks caffeine daily.    Health Maintenance  Topic Date Due  . INFLUENZA VACCINE  12/04/2015  . PAP SMEAR  06/17/2018  . TETANUS/TDAP  05/27/2022  . HIV Screening  Completed    The following portions of the patient's history were reviewed and updated as appropriate: allergies, current medications, past family history, past medical history, past social history, past surgical history and problem list.  Review of Systems A comprehensive review of systems was negative.   Objective:    BP 131/74   Pulse 84   Ht 5\' 3"  (1.6 m)   Wt 116 lb (52.6 kg)   LMP 06/25/2016 (Exact Date)   SpO2 99%   BMI 20.55 kg/m  General appearance: alert, cooperative and appears stated age Head: Normocephalic, without obvious abnormality, atraumatic Eyes: conj clear, EOMi, PEERLA Ears: normal TM's and external ear canals both ears Nose: Nares normal. Septum midline. Mucosa normal. No drainage or sinus tenderness. Throat: lips, mucosa, and tongue normal; teeth and gums normal Neck: no adenopathy, no carotid bruit, no JVD, supple, symmetrical, trachea midline and thyroid not enlarged, symmetric, no tenderness/mass/nodules Back: symmetric, no curvature. ROM normal. No CVA  tenderness. Lungs: clear to auscultation bilaterally Breasts: normal appearance, no masses or tenderness Heart: regular rate and rhythm, S1, S2 normal, no murmur, click, rub or gallop Abdomen: soft, non-tender; bowel sounds normal; no masses,  no organomegaly Extremities: extremities normal, atraumatic, no cyanosis or edema Pulses: 2+ and symmetric Skin: Skin color, texture, turgor normal. No rashes or lesions Lymph nodes: Cervical, supraclavicular, and axillary nodes normal. Neurologic: Alert and oriented X 3, normal strength and tone. Normal symmetric reflexes. Normal coordination and gait    Assessment:    Healthy female exam.     Plan:     See After Visit Summary for Counseling Recommendations    Keep up a regular exercise program and make sure you are eating a healthy diet. Try to eat 4 servings of dairy a day, or if you are lactose intolerant take a calcium with vitamin D daily.  Your vaccines are up to date.    Pregnancy-we'll try to confirm what quantity hCG. Based on LMP of every 21st she is currently 5 weeks and 1 day. Estimated due date by LMP 04/01/2017.  Will call lab and see if we can add HCG quant and Progesterone level.  Did encourage her to exercise regularly as this is safe to do during pregnancy.

## 2016-07-31 NOTE — Telephone Encounter (Signed)
Please call the lab and see if they can add on an hCG Quant to her blood work that was drawn yesterday. Also see if they would be able to add a progesterone level.  Nani Gasseratherine Metheney, MD

## 2016-08-04 ENCOUNTER — Encounter: Payer: BLUE CROSS/BLUE SHIELD | Admitting: Family Medicine

## 2016-08-04 DIAGNOSIS — Z319 Encounter for procreative management, unspecified: Secondary | ICD-10-CM | POA: Diagnosis not present

## 2016-08-11 DIAGNOSIS — O09291 Supervision of pregnancy with other poor reproductive or obstetric history, first trimester: Secondary | ICD-10-CM | POA: Diagnosis not present

## 2016-08-25 DIAGNOSIS — Z3480 Encounter for supervision of other normal pregnancy, unspecified trimester: Secondary | ICD-10-CM | POA: Diagnosis not present

## 2016-08-25 DIAGNOSIS — Z3687 Encounter for antenatal screening for uncertain dates: Secondary | ICD-10-CM | POA: Diagnosis not present

## 2016-08-25 DIAGNOSIS — Z6820 Body mass index (BMI) 20.0-20.9, adult: Secondary | ICD-10-CM | POA: Diagnosis not present

## 2016-08-25 DIAGNOSIS — O09291 Supervision of pregnancy with other poor reproductive or obstetric history, first trimester: Secondary | ICD-10-CM | POA: Diagnosis not present

## 2016-08-25 DIAGNOSIS — Z3481 Encounter for supervision of other normal pregnancy, first trimester: Secondary | ICD-10-CM | POA: Diagnosis not present

## 2016-08-25 LAB — HM HIV SCREENING LAB: HM HIV Screening: NEGATIVE

## 2016-09-11 DIAGNOSIS — Z3481 Encounter for supervision of other normal pregnancy, first trimester: Secondary | ICD-10-CM | POA: Diagnosis not present

## 2016-09-11 DIAGNOSIS — Z124 Encounter for screening for malignant neoplasm of cervix: Secondary | ICD-10-CM | POA: Diagnosis not present

## 2016-09-15 LAB — HM PAP SMEAR: HM PAP: NEGATIVE

## 2016-09-25 DIAGNOSIS — O09511 Supervision of elderly primigravida, first trimester: Secondary | ICD-10-CM | POA: Diagnosis not present

## 2016-10-08 DIAGNOSIS — R1032 Left lower quadrant pain: Secondary | ICD-10-CM | POA: Diagnosis not present

## 2016-10-08 DIAGNOSIS — Z331 Pregnant state, incidental: Secondary | ICD-10-CM | POA: Diagnosis not present

## 2016-10-20 ENCOUNTER — Encounter: Payer: Self-pay | Admitting: Family Medicine

## 2016-11-04 DIAGNOSIS — O09522 Supervision of elderly multigravida, second trimester: Secondary | ICD-10-CM | POA: Diagnosis not present

## 2016-11-04 DIAGNOSIS — Z363 Encounter for antenatal screening for malformations: Secondary | ICD-10-CM | POA: Diagnosis not present

## 2016-11-07 DIAGNOSIS — R5383 Other fatigue: Secondary | ICD-10-CM | POA: Diagnosis not present

## 2016-11-07 DIAGNOSIS — Z331 Pregnant state, incidental: Secondary | ICD-10-CM | POA: Diagnosis not present

## 2016-11-10 ENCOUNTER — Telehealth: Payer: Self-pay | Admitting: Family Medicine

## 2016-11-10 NOTE — Telephone Encounter (Signed)
PT SAID SHE JUST MISSED YOUR CALL. SHE WILL BE AT OUR PHONE NUMBER UNTIL ABOUT 5:30.  THANK YOU.

## 2016-11-10 NOTE — Telephone Encounter (Signed)
She is calling about her husband. See chart [id number].

## 2016-12-03 DIAGNOSIS — O3680X1 Pregnancy with inconclusive fetal viability, fetus 1: Secondary | ICD-10-CM | POA: Diagnosis not present

## 2016-12-15 DIAGNOSIS — S40261A Insect bite (nonvenomous) of right shoulder, initial encounter: Secondary | ICD-10-CM | POA: Diagnosis not present

## 2016-12-15 DIAGNOSIS — S40262A Insect bite (nonvenomous) of left shoulder, initial encounter: Secondary | ICD-10-CM | POA: Diagnosis not present

## 2016-12-15 DIAGNOSIS — S0086XA Insect bite (nonvenomous) of other part of head, initial encounter: Secondary | ICD-10-CM | POA: Diagnosis not present

## 2016-12-15 DIAGNOSIS — S1096XA Insect bite of unspecified part of neck, initial encounter: Secondary | ICD-10-CM | POA: Diagnosis not present

## 2016-12-22 ENCOUNTER — Encounter: Payer: Self-pay | Admitting: Family Medicine

## 2016-12-22 MED ORDER — EPINEPHRINE 0.3 MG/0.3ML IJ SOAJ: 0.3000 mg | Freq: Once | INTRAMUSCULAR | 0 refills | Status: AC

## 2017-01-09 DIAGNOSIS — Z23 Encounter for immunization: Secondary | ICD-10-CM | POA: Diagnosis not present

## 2017-01-09 DIAGNOSIS — Z3482 Encounter for supervision of other normal pregnancy, second trimester: Secondary | ICD-10-CM | POA: Diagnosis not present

## 2017-01-09 DIAGNOSIS — Z3493 Encounter for supervision of normal pregnancy, unspecified, third trimester: Secondary | ICD-10-CM | POA: Diagnosis not present

## 2017-01-23 DIAGNOSIS — O99810 Abnormal glucose complicating pregnancy: Secondary | ICD-10-CM | POA: Diagnosis not present

## 2017-02-05 DIAGNOSIS — E049 Nontoxic goiter, unspecified: Secondary | ICD-10-CM | POA: Diagnosis not present

## 2017-02-05 DIAGNOSIS — Z23 Encounter for immunization: Secondary | ICD-10-CM | POA: Diagnosis not present

## 2017-03-04 DIAGNOSIS — Z3493 Encounter for supervision of normal pregnancy, unspecified, third trimester: Secondary | ICD-10-CM | POA: Diagnosis not present

## 2017-03-25 DIAGNOSIS — R51 Headache: Secondary | ICD-10-CM | POA: Diagnosis not present

## 2017-03-25 DIAGNOSIS — Z3483 Encounter for supervision of other normal pregnancy, third trimester: Secondary | ICD-10-CM | POA: Diagnosis not present

## 2017-04-03 DIAGNOSIS — Z3A39 39 weeks gestation of pregnancy: Secondary | ICD-10-CM | POA: Diagnosis not present

## 2017-04-03 DIAGNOSIS — Z79899 Other long term (current) drug therapy: Secondary | ICD-10-CM | POA: Diagnosis not present

## 2017-04-03 DIAGNOSIS — Z888 Allergy status to other drugs, medicaments and biological substances status: Secondary | ICD-10-CM | POA: Diagnosis not present

## 2017-04-03 DIAGNOSIS — K219 Gastro-esophageal reflux disease without esophagitis: Secondary | ICD-10-CM | POA: Diagnosis not present

## 2017-04-03 DIAGNOSIS — O9962 Diseases of the digestive system complicating childbirth: Secondary | ICD-10-CM | POA: Diagnosis not present

## 2017-04-03 DIAGNOSIS — Z91030 Bee allergy status: Secondary | ICD-10-CM | POA: Diagnosis not present

## 2017-05-15 ENCOUNTER — Encounter: Payer: Self-pay | Admitting: Family Medicine

## 2017-05-20 DIAGNOSIS — Z6821 Body mass index (BMI) 21.0-21.9, adult: Secondary | ICD-10-CM | POA: Diagnosis not present

## 2017-05-20 DIAGNOSIS — E0590 Thyrotoxicosis, unspecified without thyrotoxic crisis or storm: Secondary | ICD-10-CM | POA: Diagnosis not present

## 2017-05-29 ENCOUNTER — Encounter: Payer: Self-pay | Admitting: Family Medicine

## 2017-05-29 DIAGNOSIS — Z8639 Personal history of other endocrine, nutritional and metabolic disease: Secondary | ICD-10-CM

## 2017-06-26 ENCOUNTER — Ambulatory Visit (INDEPENDENT_AMBULATORY_CARE_PROVIDER_SITE_OTHER): Payer: BLUE CROSS/BLUE SHIELD | Admitting: Family Medicine

## 2017-06-26 ENCOUNTER — Encounter: Payer: Self-pay | Admitting: Family Medicine

## 2017-06-26 VITALS — BP 107/64 | HR 96 | Temp 98.1°F | Ht 63.0 in | Wt 117.0 lb

## 2017-06-26 DIAGNOSIS — L600 Ingrowing nail: Secondary | ICD-10-CM

## 2017-06-26 DIAGNOSIS — J029 Acute pharyngitis, unspecified: Secondary | ICD-10-CM

## 2017-06-26 DIAGNOSIS — Z8639 Personal history of other endocrine, nutritional and metabolic disease: Secondary | ICD-10-CM | POA: Diagnosis not present

## 2017-06-26 LAB — T4, FREE: FREE T4: 1.1 ng/dL (ref 0.8–1.8)

## 2017-06-26 LAB — TSH: TSH: 0.01 mIU/L — ABNORMAL LOW

## 2017-06-26 NOTE — Progress Notes (Signed)
Subjective:    Patient ID: Tina Pearson, female    DOB: [birth year], 37 y.o.   MRN: [id number]  HPI 37 year old female with a history of goiter comes in today complaining of a sore throat particularly on the right side for about 1.5 weeks.  It has been progressively more bothersome with sharp intermittant pain.  She denies any dysphagia or fever or chills. She id have a URI in early Jan that lasted for abou 3.5 weeks before this started. No ear pain.    She would also like me to look at her left great toe.  She feels like the nail is ingrown again.  She is at the point where she would like to have it removed.  Unfortunately the nail curves into an almost U shape and then pinches the tissue.  It is more painful along the lateral border.   Review of Systems  BP 107/64   Pulse 96   Temp 98.1 F (36.7 C)   Ht 5\' 3"  (1.6 m)   Wt 117 lb (53.1 kg)   SpO2 99%   BMI 20.73 kg/m     Allergies  Allergen Reactions  . Sumatriptan Nausea And Vomiting  . Yellow Jacket Venom [Bee Venom] Swelling and Rash    Past Medical History:  Diagnosis Date  . Hyperlipidemia   . Migraines   . Post partum thyroiditis    Dr. Morrison Old    Past Surgical History:  Procedure Laterality Date  . VEIN SURGERY Right 01/2015   [facility].   . WISDOM TOOTH EXTRACTION      Social History   Socioeconomic History  . Marital status: Married    Spouse name: Not on file  . Number of children: 1  . Years of education: Not on file  . Highest education level: Not on file  Social Needs  . Financial resource strain: Not on file  . Food insecurity - worry: Not on file  . Food insecurity - inability: Not on file  . Transportation needs - medical: Not on file  . Transportation needs - non-medical: Not on file  Occupational History  . Occupation: Chief Financial Officer    Tobacco Use  . Smoking status: Never Smoker  . Smokeless tobacco: Never Used  Substance and Sexual Activity  . Alcohol use: Not on file   Comment: rare  . Drug use: Not on file  . Sexual activity: Not on file    Comment: Occupational psychologist, runs 2 X week, eats healthy, married, no kids.  Other Topics Concern  . Not on file  Social History Narrative   Some exercise. Drinks caffeine daily.     Family History  Problem Relation Age of Onset  . Heart attack Father   . Hyperlipidemia Father   . Heart attack Paternal Grandfather   . Hyperlipidemia Mother   . Stroke Paternal Grandfather     Outpatient Encounter Medications as of 06/26/2017  Medication Sig  . EPINEPHrine 0.3 mg/0.3 mL IJ SOAJ injection Inject into the skin.  . frovatriptan (FROVA) 2.5 MG tablet Take 2.5 mg by mouth as needed for migraine. If recurs, may repeat after 2 hours. Max of 3 tabs in 24 hours.   No facility-administered encounter medications on file as of 06/26/2017.          Objective:   Physical Exam  Constitutional: She is oriented to person, place, and time. She appears well-developed and well-nourished.  HENT:  Head: Normocephalic and atraumatic.  Right Ear: External ear  normal.  Left Ear: External ear normal.  Nose: Nose normal.  Mouth/Throat: Oropharynx is clear and moist.  TMs and canals are clear.   Eyes: Conjunctivae and EOM are normal. Pupils are equal, round, and reactive to light.  Neck: Neck supple. Thyromegaly present.  Large goiter more pronounced on the right compared to the left.  Nontender on exam today.  Did feel tender over the right anterior upper cervical neck close to where the neck meets the jawline.  Cardiovascular: Normal rate, regular rhythm and normal heart sounds.  Pulmonary/Chest: Effort normal and breath sounds normal. She has no wheezes.  Lymphadenopathy:    She has no cervical adenopathy.  Neurological: She is alert and oriented to person, place, and time.  Skin: Skin is warm and dry.  Psychiatric: She has a normal mood and affect.        Assessment & Plan:  Right sided ST -unclear etiology.  Ear  looks good, throat looks good.  I do not feel any significant cervical lymphadenopathy.  She clearly has a goiter on exam but where her pain is is superior to this.  We discussed options.  Will refer to ENT for further workup.  It could be that she has a sore ulcer in the throat, or food particle that is lodged etc.  She actually has an upcoming appointment with her endocrinologist the next couple weeks.  Also encouraged her to discuss this with him as well.  She is not having any dysphagia which is reassuring.  Left great toenail, ingrown -Discussed options.  Recommend removal.  We also discussed the option of doing cautery on the lateral borders of the nail narrow.  She would like to do that today.  Toenail Avulsion Procedure Note  Pre-operative Diagnosis: Left Ingrown Great toenail   Post-operative Diagnosis: Left Ingrown Great toenail  Indications: pain and inflammation  Anesthesia: Lidocaine 1% without epinephrine with added sodium bicarbonate  Procedure Details  The risks (including bleeding and infection) and benefits of the  procedure and Verbal informed consent obtained.  After digital block anesthesia was obtained, a tourniquet was applied for hemostasis during the procedure.  After prepping with Betadine, the offending edge of the nail was freed from the nailbed and perionychium, and then split with scissors and removed with  forceps.  All visible granulation tissue is debrided. Antibiotic and bulky dressing was applied.   Findings: Ingrown nail.    Complications:  none.  Plan: 1. Soak the foot twice daily. Change dressing twice daily until healed over. 2. Warning signs of infection were reviewed.   3. Recommended that the patient use OTC acetaminophen as needed for pain.  Elevated over the weekend.  Ice if needed.    4. Return PRN.

## 2017-06-29 ENCOUNTER — Encounter: Payer: Self-pay | Admitting: Family Medicine

## 2017-07-01 DIAGNOSIS — L600 Ingrowing nail: Secondary | ICD-10-CM | POA: Diagnosis not present

## 2017-08-19 DIAGNOSIS — O905 Postpartum thyroiditis: Secondary | ICD-10-CM | POA: Diagnosis not present

## 2017-08-19 DIAGNOSIS — E041 Nontoxic single thyroid nodule: Secondary | ICD-10-CM | POA: Diagnosis not present

## 2017-09-24 ENCOUNTER — Encounter: Payer: Self-pay | Admitting: Family Medicine

## 2017-10-23 DIAGNOSIS — O905 Postpartum thyroiditis: Secondary | ICD-10-CM | POA: Diagnosis not present

## 2017-10-23 DIAGNOSIS — E041 Nontoxic single thyroid nodule: Secondary | ICD-10-CM | POA: Diagnosis not present

## 2018-02-08 ENCOUNTER — Encounter: Payer: Self-pay | Admitting: Family Medicine

## 2018-02-18 ENCOUNTER — Encounter: Payer: Self-pay | Admitting: Family Medicine

## 2018-02-19 MED ORDER — FROVATRIPTAN SUCCINATE 2.5 MG PO TABS: 2.5000 mg | ORAL_TABLET | Freq: Every day | ORAL | 1 refills | Status: DC | PRN

## 2018-03-11 ENCOUNTER — Encounter: Payer: Self-pay | Admitting: Family Medicine

## 2018-04-12 ENCOUNTER — Ambulatory Visit: Payer: BLUE CROSS/BLUE SHIELD | Admitting: Physician Assistant

## 2018-04-12 ENCOUNTER — Ambulatory Visit (INDEPENDENT_AMBULATORY_CARE_PROVIDER_SITE_OTHER): Payer: BLUE CROSS/BLUE SHIELD | Admitting: Family Medicine

## 2018-04-12 VITALS — BP 141/94 | HR 82 | Ht 63.0 in | Wt 126.0 lb

## 2018-04-12 DIAGNOSIS — R42 Dizziness and giddiness: Secondary | ICD-10-CM | POA: Diagnosis not present

## 2018-04-12 MED ORDER — MECLIZINE HCL 25 MG PO TABS: 25.0000 mg | ORAL_TABLET | Freq: Three times a day (TID) | ORAL | 3 refills | Status: DC | PRN

## 2018-04-12 MED ORDER — DIAZEPAM 5 MG PO TABS: 5.0000 mg | ORAL_TABLET | Freq: Two times a day (BID) | ORAL | 1 refills | Status: DC | PRN

## 2018-04-12 MED ORDER — PROMETHAZINE HCL 25 MG RE SUPP: 25.0000 mg | Freq: Four times a day (QID) | RECTAL | 0 refills | Status: DC | PRN

## 2018-04-12 MED ORDER — PREDNISONE 10 MG PO TABS: 30.0000 mg | ORAL_TABLET | Freq: Every day | ORAL | 0 refills | Status: DC

## 2018-04-12 NOTE — Patient Instructions (Signed)
Thank you for coming in today. I think you have vertigo.  Take the meclozine and prednisone.  If not improved take the valium as needed.  If vomiting ok to use rectal phenergan.  Attend vestibular PT.   Recheck if not better or if worse.    Benign Positional Vertigo Vertigo is the feeling that you or your surroundings are moving when they are not. Benign positional vertigo is the most common form of vertigo. The cause of this condition is not serious (is benign). This condition is triggered by certain movements and positions (is positional). This condition can be dangerous if it occurs while you are doing something that could endanger you or others, such as driving. What are the causes? In many cases, the cause of this condition is not known. It may be caused by a disturbance in an area of the inner ear that helps your brain to sense movement and balance. This disturbance can be caused by a viral infection (labyrinthitis), head injury, or repetitive motion. What increases the risk? This condition is more likely to develop in:  Women.  People who are 77 years of age or older.  What are the signs or symptoms? Symptoms of this condition usually happen when you move your head or your eyes in different directions. Symptoms may start suddenly, and they usually last for less than a minute. Symptoms may include:  Loss of balance and falling.  Feeling like you are spinning or moving.  Feeling like your surroundings are spinning or moving.  Nausea and vomiting.  Blurred vision.  Dizziness.  Involuntary eye movement (nystagmus).  Symptoms can be mild and cause only slight annoyance, or they can be severe and interfere with daily life. Episodes of benign positional vertigo may return (recur) over time, and they may be triggered by certain movements. Symptoms may improve over time. How is this diagnosed? This condition is usually diagnosed by medical history and a physical exam of the  head, neck, and ears. You may be referred to a health care provider who specializes in ear, nose, and throat (ENT) problems (otolaryngologist) or a provider who specializes in disorders of the nervous system (neurologist). You may have additional testing, including:  MRI.  A CT scan.  Eye movement tests. Your health care provider may ask you to change positions quickly while he or she watches you for symptoms of benign positional vertigo, such as nystagmus. Eye movement may be tested with an electronystagmogram (ENG), caloric stimulation, the Dix-Hallpike test, or the roll test.  An electroencephalogram (EEG). This records electrical activity in your brain.  Hearing tests.  How is this treated? Usually, your health care provider will treat this by moving your head in specific positions to adjust your inner ear back to normal. Surgery may be needed in severe cases, but this is rare. In some cases, benign positional vertigo may resolve on its own in 2-4 weeks. Follow these instructions at home: Safety  Move slowly.Avoid sudden body or head movements.  Avoid driving.  Avoid operating heavy machinery.  Avoid doing any tasks that would be dangerous to you or others if a vertigo episode would occur.  If you have trouble walking or keeping your balance, try using a cane for stability. If you feel dizzy or unstable, sit down right away.  Return to your normal activities as told by your health care provider. Ask your health care provider what activities are safe for you. General instructions  Take over-the-counter and prescription medicines only as  told by your health care provider.  Avoid certain positions or movements as told by your health care provider.  Drink enough fluid to keep your urine clear or pale yellow.  Keep all follow-up visits as told by your health care provider. This is important. Contact a health care provider if:  You have a fever.  Your condition gets worse or  you develop new symptoms.  Your family or friends notice any behavioral changes.  Your nausea or vomiting gets worse.  You have numbness or a "pins and needles" sensation. Get help right away if:  You have difficulty speaking or moving.  You are always dizzy.  You faint.  You develop severe headaches.  You have weakness in your legs or arms.  You have changes in your hearing or vision.  You develop a stiff neck.  You develop sensitivity to light. This information is not intended to replace advice given to you by your health care provider. Make sure you discuss any questions you have with your health care provider. Document Released: 01/27/2006 Document Revised: 09/27/2015 Document Reviewed: 08/14/2014 Elsevier Interactive Patient Education  2018 [employer]   Labyrinthitis Labyrinthitis is an infection of the inner ear. Your inner ear is a fluid-filled system of tubes and canals (labyrinth). Nerve cells in your inner ear send signals for hearing and balance to your brain. When tiny germs (microorganisms) get inside the labyrinth, they harm the cells that send messages to the brain. Labyrinthitis can cause changes in hearing and balance. Most cases of labyrinthitis come on suddenly and they clear up within weeks. If the infection damages parts of the labyrinth, some symptoms may remain (chronic labyrinthitis). What are the causes? Viruses are the most common cause of labyrinthitis. Viruses that spread into the labyrinth are the same viruses that cause other diseases, such as:  Mononucleosis.  Measles.  Flu.  Herpes.  Bacteria can also cause labyrinthitis when they spread into the labyrinth from an infection in the brain or the middle ear. Bacteria can cause:  Serous labyrinthitis. This type of labyrinthitis develops when bacteria produce a poison (toxin) that gets inside the labyrinth.  Suppurative labyrinthitis. This type of labyrinthitis develops when bacteria get  inside the labyrinth.  What increases the risk? You may be at greater risk for labyrinthitis if you:  Drink a lot of alcohol.  Smoke.  Take certain drugs.  Are not well rested (fatigued).  Are under a lot of stress.  Have allergies.  Recently had a nose or throat infection (upper respiratory infection) or an ear infection.  What are the signs or symptoms? Symptoms of labyrinthitis usually start suddenly. The symptoms can be mild or strong and may include:  Dizziness.  Hearing loss.  A feeling that you are moving when you are not (vertigo).  Ringing in the ear (tinnitus).  Nausea and vomiting.  Trouble focusing your eyes.  Symptoms of chronic labyrinthitis may include:  Fatigue.  Confusion.  Hearing loss.  Tinnitus.  Poor balance.  Vertigo after sudden head movements.  How is this diagnosed? Your health care provider may suspect labyrinthitis if you suddenly get dizzy and lose hearing, especially if you had a recent upper respiratory infection. Your health care provider will perform a physical exam to:  Check your ears for infection.  Test your balance.  Check your eye movement.  Your health care provider may do several tests to rule out other causes of your symptoms and to help make a diagnosis of labyrinthitis. These may  include:  Imaging studies, such as a CT scan or an MRI, to look for other causes of your symptoms.  Hearing tests.  Electronystagmography (ENG) to check your balance.  How is this treated? Treatment of labyrinthitis depends on the cause. If your labyrinthitis is caused by a virus, it may get better without treatment. If your labyrinthitis is caused by bacteria, you may need medicine to fight the infection (antibiotic medicine). You may also have treatment to relieve labyrinthitis symptoms. Treatments may include:  Medicines to: ? Stop dizziness. ? Relieve nausea. ? Treat the inflamed area. ? Speed up your recovery.  Bed rest  until dizziness goes away.  Fluids given through an IV tube. You may need this treatment if you have too little fluid in your body (dehydrated) from repeated nausea and vomiting.  Follow these instructions at home:  Take medicines only as directed by your health care provider.  If you were prescribed an antibiotic medicine, finish all of it even if you start to feel better.  Rest as much as possible.  Avoid loud noises and bright lights.  Do not make sudden movements until any dizziness goes away.  Do not drive until your health care provider says that you can.  Drink enough fluid to keep your urine clear or pale yellow.  Work with a physical therapist if you still feel dizzy after several weeks. A therapist can teach you exercises to help you adjust to feeling dizzy (vestibular rehabilitation exercises).  Keep all follow-up visits as directed by your health care provider. This is important. Contact a health care provider if:  Your symptoms are not relieved by medicines.  Your symptoms last longer than two weeks.  You have a fever. Get help right away if:  You become very dizzy.  You have nausea or vomiting that does not go away.  Your hearing gets much worse very quickly. This information is not intended to replace advice given to you by your health care provider. Make sure you discuss any questions you have with your health care provider. Document Released: 05/12/2014 Document Revised: 09/27/2015 Document Reviewed: 12/21/2013 Elsevier Interactive Patient Education  [employer].

## 2018-04-12 NOTE — Progress Notes (Signed)
Tina Pearson is a 37 y.o. female who [facility]: Primary Care Sports Medicine today for room spinning dizziness.  Symptoms started abruptly this morning.  Patient notes severe symptoms of room spinning associated with nausea.  She notes this is settling down and improving now.  She has symptoms worse when she turns her head or rolls over.  She has not had a chance to take any medication.  She denies similar episodes.  She notes that she currently has a cold with mild congestion and runny nose.  She denies any weakness or numbness slurred speech loss of vision or function.  She can use both upper and lower extremities normally.  She notes when she was very dizzy she had trouble walking in a straight line.  She notes this has significantly improved.   ROS as above:  Exam:  BP (!) 141/94   Pulse 82   Ht 5\' 3"  (1.6 m)   Wt 126 lb (57.2 kg)   BMI 22.32 kg/m  Wt Readings from Last 5 Encounters:  04/12/18 126 lb (57.2 kg)  06/26/17 117 lb (53.1 kg)  07/31/16 116 lb (52.6 kg)  11/07/15 111 lb (50.3 kg)  07/24/15 111 lb (50.3 kg)    Gen: Well NAD HEENT: EOMI,  MMM normal tympanic membranes bilaterally. Lungs: Normal work of breathing. CTABL Heart: RRR no MRG Abd: NABS, Soft. Nondistended, Nontender Exts: Brisk capillary refill, warm and well perfused.  Neuro alert and oriented normal coordination.  Patient is able to balance with tandem stance.  Normal finger-nose-finger.  Normal reflexes strength and sensation throughout. Positive left-sided Dix-Hallpike test.  Negative right.  Lab and Radiology Results No results found for this or any previous visit (from the past 72 hour(s)). No results found.    Assessment and Plan: 37 y.o. female with dizziness due to vertigo due to likely BPPV.  May be a component of labyrinthitis.  Treat with referral to vestibular physical therapy, meclizine.   Additionally will use Valium as backup if still very significantly dizzy.  Additionally will use rectal Phenergan if vomiting and cannot keep oral pills down.  Lastly will use a short course of prednisone for the possibility this may be labyrinthitis.  Labyrinthitis is a possibility given her viral URI symptoms.  Discussion regarding differential diagnosis and treatment.  I spent 25 minutes with this patient, greater than 50% was face-to-face time counseling regarding ddx and plan.    No orders of the defined types were placed in this encounter.  No orders of the defined types were placed in this encounter.    Historical information moved to improve visibility of documentation.  Past Medical History:  Diagnosis Date  . Hyperlipidemia   . Migraines   . Post partum thyroiditis    Dr. Morrison Old[place]   Past Surgical History:  Procedure Laterality Date  . VEIN SURGERY Right 01/2015   [facility].   . WISDOM TOOTH EXTRACTION     Social History   Tobacco Use  . Smoking status: Never Smoker  . Smokeless tobacco: Never Used  Substance Use Topics  . Alcohol use: Not on file    Comment: rare   family history includes Heart attack in her father and paternal grandfather; Hyperlipidemia in her father and mother; Stroke in her paternal grandfather.  Medications: Current Outpatient Medications  Medication Sig Dispense Refill  . EPINEPHrine 0.3 mg/0.3 mL IJ SOAJ injection Inject into the skin.    .Marland Kitchen  frovatriptan (FROVA) 2.5 MG tablet Take 1 tablet (2.5 mg total) by mouth daily as needed for migraine. If recurs, may repeat after 2 hours. Max of 3 tabs in 24 hours. 27 tablet 1   No current facility-administered medications for this visit.    Allergies  Allergen Reactions  . Sumatriptan Nausea And Vomiting  . Yellow Jacket Venom [Bee Venom] Swelling and Rash     Discussed warning signs or symptoms. Please see discharge instructions. Patient expresses understanding.

## 2018-04-23 IMAGING — US US PELVIS COMPLETE
1 series · 14 of 25 positions shown · non-contrast
Comparison: None

CLINICAL DATA: Abnormal uterine bleeding.

EXAM:
TRANSABDOMINAL AND TRANSVAGINAL ULTRASOUND OF PELVIS
TECHNIQUE: Both transabdominal and transvaginal ultrasound examinations of the
pelvis were performed. Transabdominal technique was performed for
global imaging of the pelvis including uterus, ovaries, adnexal
regions, and pelvic cul-de-sac. It was necessary to proceed with
endovaginal exam following the transabdominal exam to visualize the
uterus, ovaries, and adnexa.

[Series 1: us pelvis complete · 0.20mm/px · 14 of 70 slices shown]
[im 1/70]
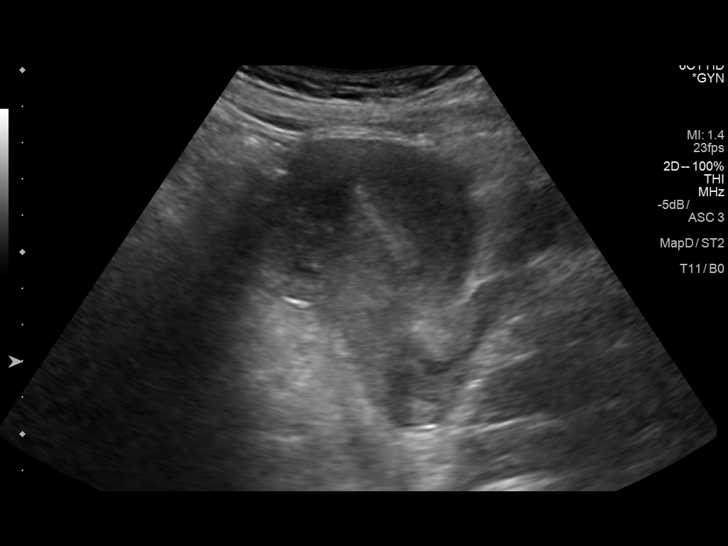
[im 6/70]
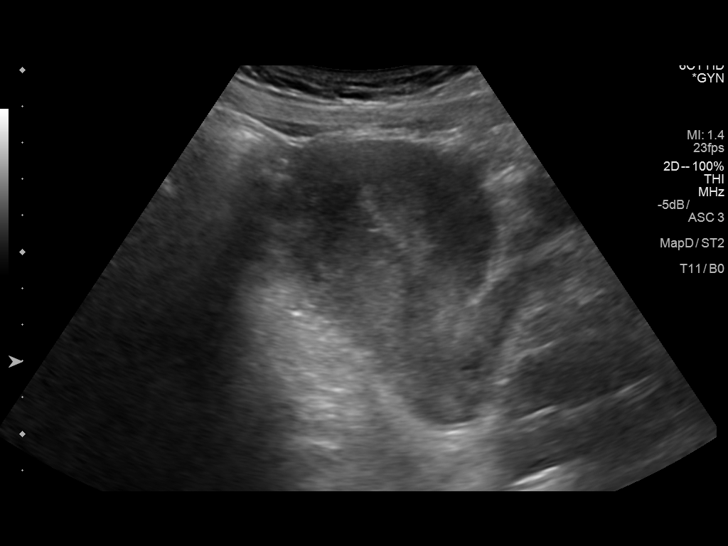
[im 12/70]
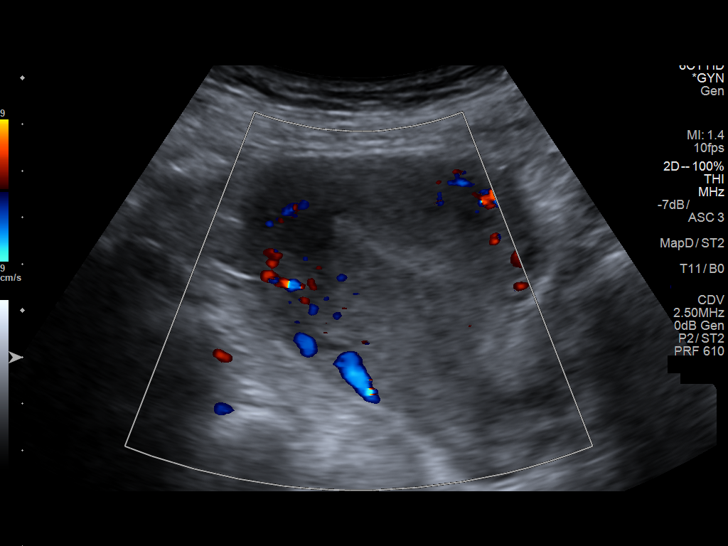
[im 18/70]
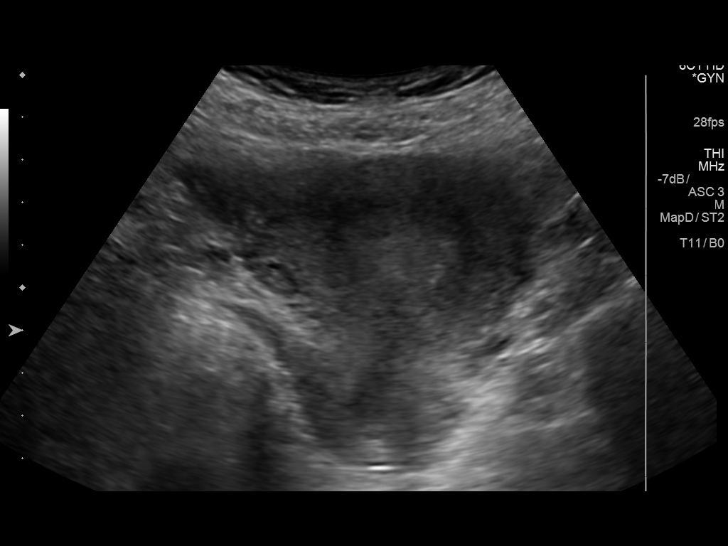
[im 24/70]
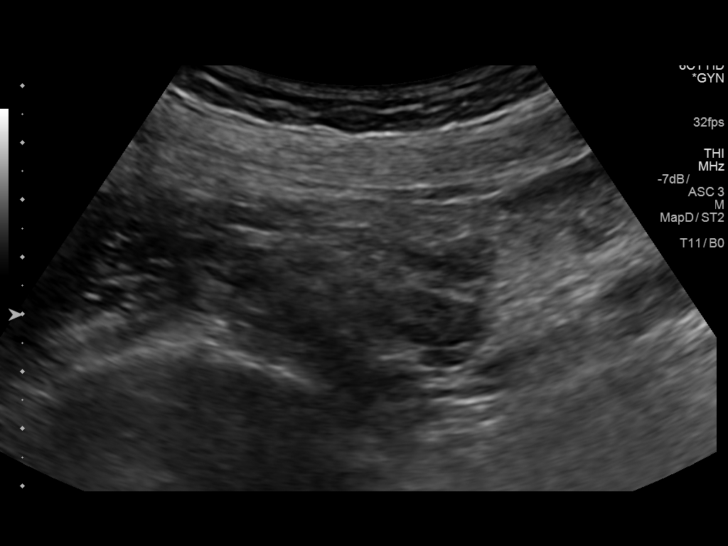
[im 26/70]
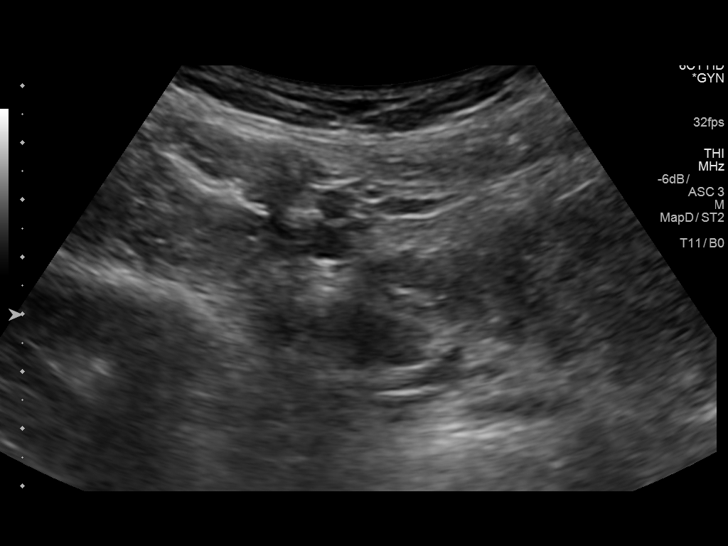
[im 32/70]
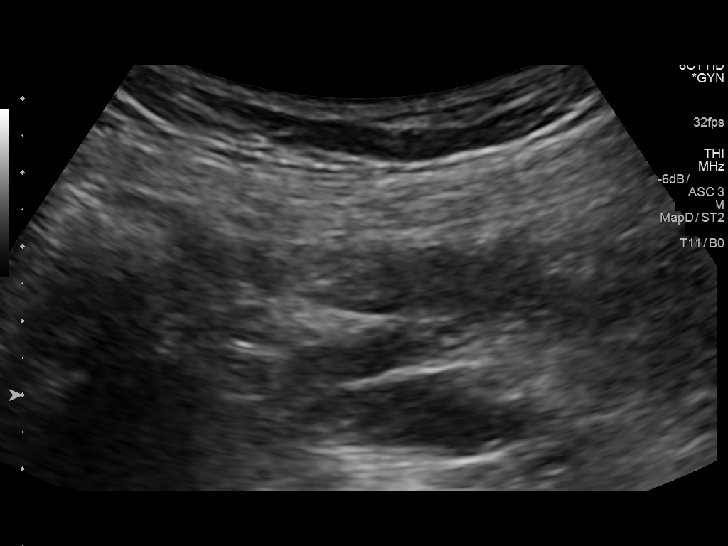
[im 38/70]
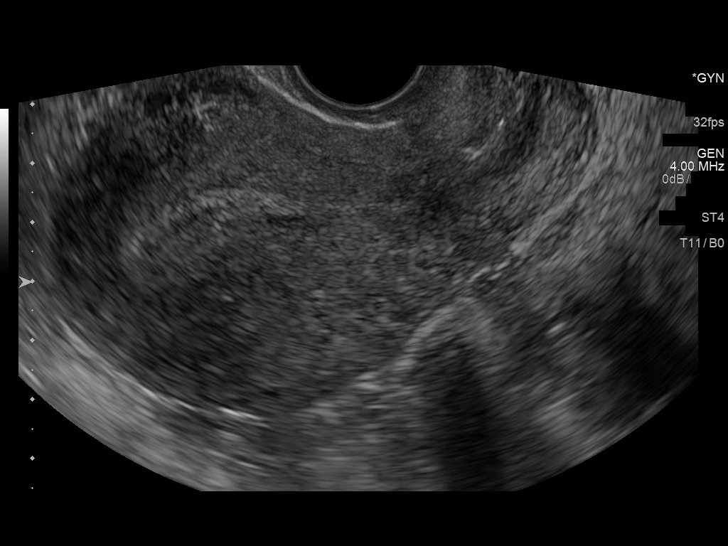
[im 44/70]
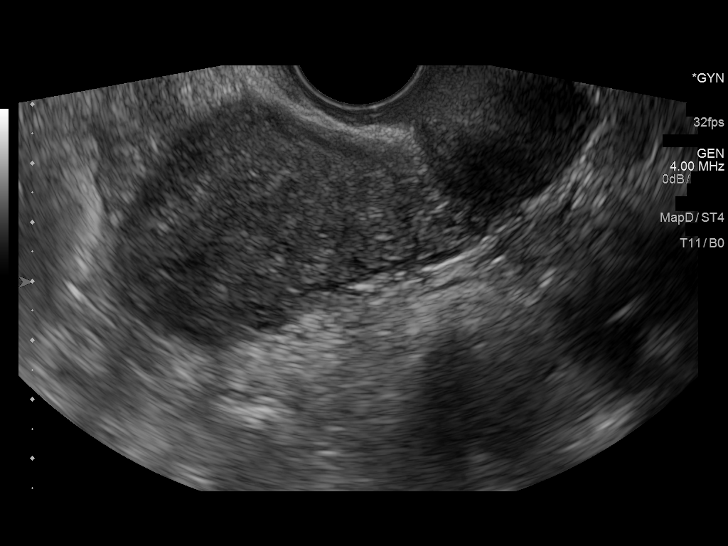
[im 47/70]
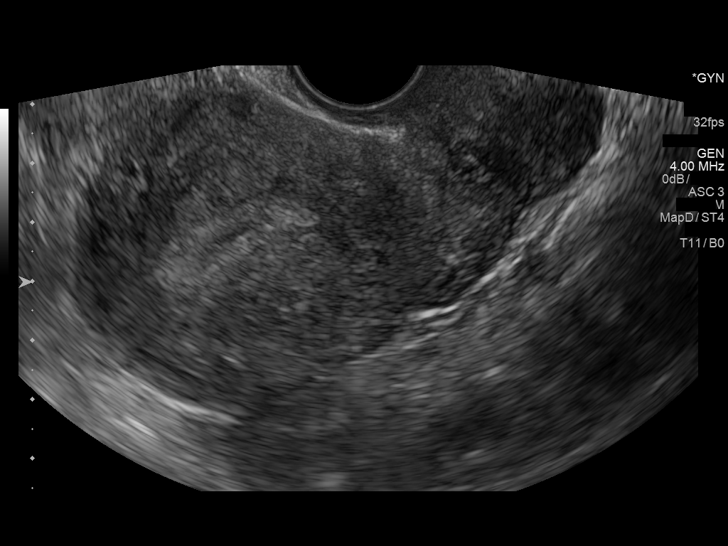
[im 52/70]
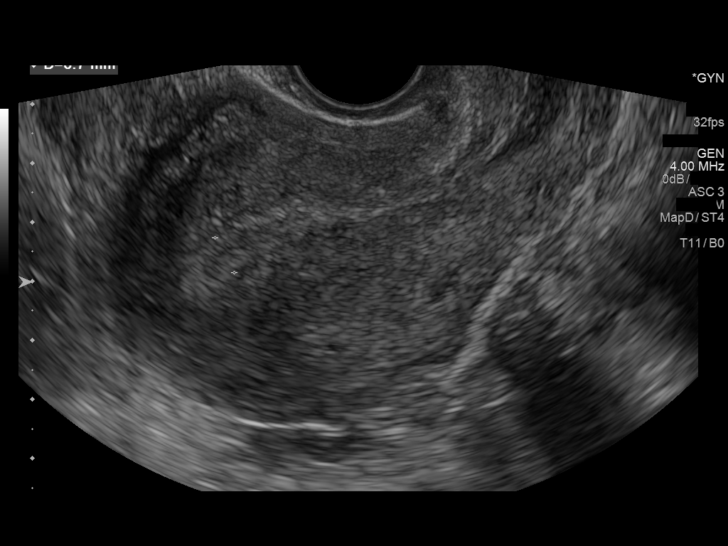
[im 58/70]
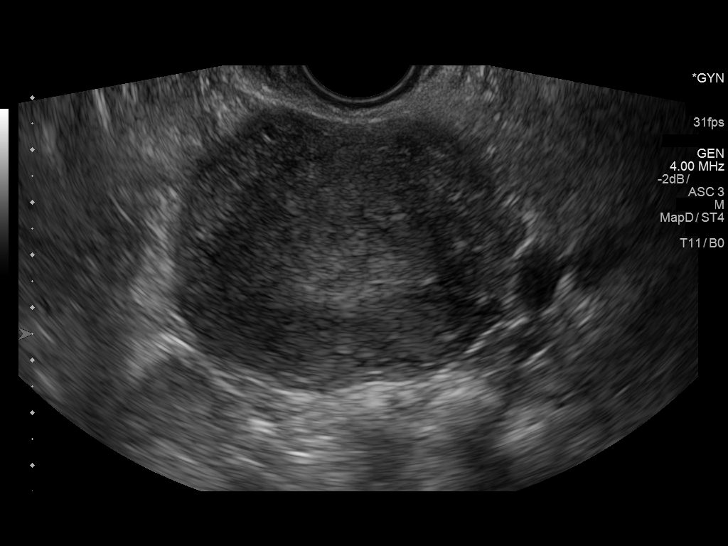
[im 64/70]
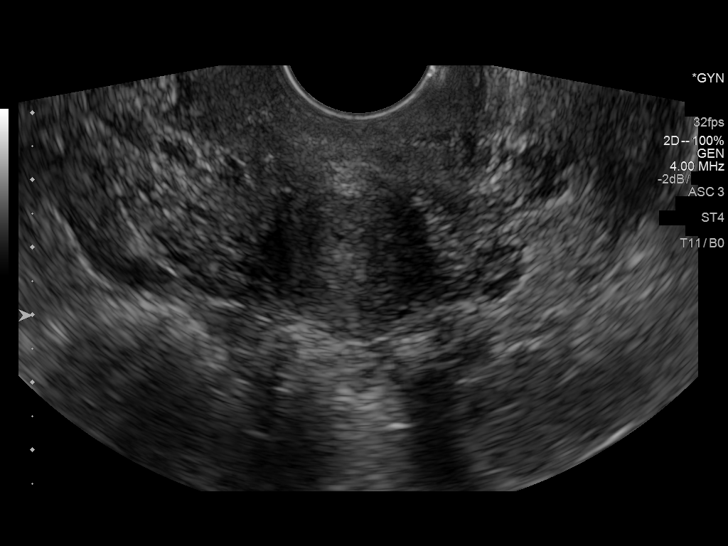
[im 70/70]
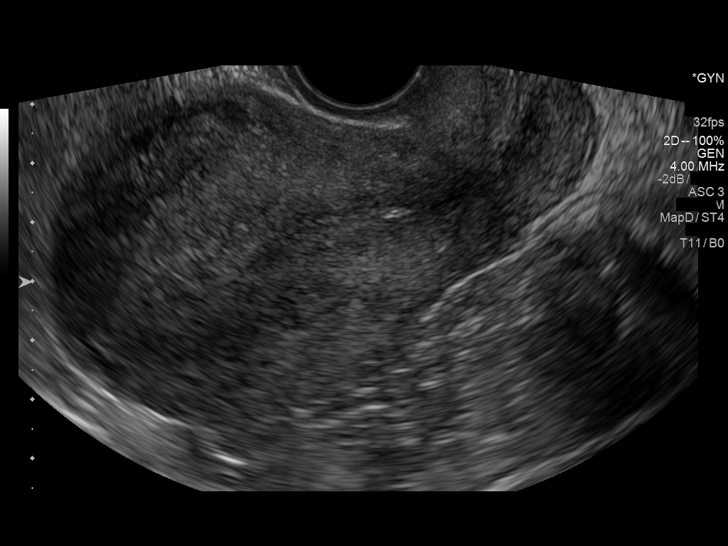

[14 of 25 positions shown; findings below may reference images not displayed]

FINDINGS: Uterus

Measurements: 9.5 x 6.0 x 6.2 cm = volume: 185 mL. No fibroids or
other mass visualized.

Endometrium

Thickness: Normal, 7 mm..  No focal abnormality visualized.

Right ovary

Measurements: 2.0 x 0.9 x 1.8 cm = volume: 2.0 mL. Normal
appearance/no adnexal mass.

Left ovary

Measurements: 2.6 x 0.9 x 1.6 cm = volume: 2.0 mL. Normal
appearance/no adnexal mass.

Other findings

No abnormal free fluid.
IMPRESSION: Normal pelvic ultrasound for age. No explanation for abnormal
uterine bleeding.

## 2018-04-26 ENCOUNTER — Encounter: Payer: Self-pay | Admitting: Family Medicine

## 2018-04-26 ENCOUNTER — Ambulatory Visit (INDEPENDENT_AMBULATORY_CARE_PROVIDER_SITE_OTHER): Payer: BLUE CROSS/BLUE SHIELD | Admitting: Family Medicine

## 2018-04-26 VITALS — BP 123/73 | HR 80 | Ht 63.0 in | Wt 123.0 lb

## 2018-04-26 DIAGNOSIS — Z0000 Encounter for general adult medical examination without abnormal findings: Secondary | ICD-10-CM | POA: Diagnosis not present

## 2018-04-26 DIAGNOSIS — R7989 Other specified abnormal findings of blood chemistry: Secondary | ICD-10-CM | POA: Diagnosis not present

## 2018-04-26 DIAGNOSIS — N920 Excessive and frequent menstruation with regular cycle: Secondary | ICD-10-CM

## 2018-04-26 MED ORDER — LEVONORGEST-ETH ESTRAD 91-DAY 0.15-0.03 MG PO TABS: 1.0000 | ORAL_TABLET | Freq: Every day | ORAL | 1 refills | Status: DC

## 2018-04-26 NOTE — Progress Notes (Addendum)
Subjective:     Tina Pearson is a 37 y.o. female and is here for a comprehensive physical exam. The patient reports no problems.  She still having abnormal thyroid levels after giving birth to her child a year ago.  She was hyperthyroid and now hypothyroid.  She is not currently on medication but they are just monitoring her.  She would like to go ahead and recheck her level today.  She is hoping that everything will re-equilibrate and get back to normal.  She also wanted to let me know that she has been having some heavy periods ever since she had her child.  She says that she is going through heavy pads almost hourly and that her period will last him as an entire week.  This happened initially after her first child but said it re-regulated pretty quickly this time it is been going on for months.  Social History   Socioeconomic History  . Marital status: Married    Spouse name: Not on file  . Number of children: 1  . Years of education: Not on file  . Highest education level: Not on file  Occupational History  . 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 Financial resource strain: Not on file  . Food insecurity:    Worry: Not on file    Inability: Not on file  . Transportation needs:    Medical: Not on file    Non-medical: Not on file  Tobacco Use  . Smoking status: Never Smoker  . Smokeless tobacco: Never Used  Substance and Sexual Activity  . Alcohol use: Not on file    Comment: rare  . Drug use: Not on file  . Sexual activity: Not on file    Comment: Occupational psychologistmarketing coordinator, runs 2 X week, eats healthy, married, no kids.  Lifestyle  . Physical activity:    Days per week: Not on file    Minutes per session: Not on file  . Stress: Not on file  Relationships  . Social connections:    Talks on phone: Not on file    Gets together: Not on file    Attends religious service: Not on file    Active member of club or organization: Not on file    Attends meetings of clubs or  organizations: Not on file    Relationship status: Not on file  . Intimate partner violence:    Fear of current or ex partner: Not on file    Emotionally abused: Not on file    Physically abused: Not on file    Forced sexual activity: Not on file  Other Topics Concern  . Not on file  Social History Narrative   Some exercise. Drinks caffeine daily.    Health Maintenance  Topic Date Due  . PAP SMEAR-Modifier  09/16/2019  . TETANUS/TDAP  01/10/2027  . INFLUENZA VACCINE  Completed  . HIV Screening  Completed    The following portions of the patient's history were reviewed and updated as appropriate: allergies, current medications, past family history, past medical history, past social history, past surgical history and problem list.  Review of Systems A comprehensive review of systems was negative.   Objective:    BP 123/73   Pulse 80   Ht 5\' 3"  (1.6 m)   Wt 123 lb (55.8 kg)   LMP 04/21/2018 (Exact Date)   SpO2 99%   BMI 21.79 kg/m  General appearance: alert, cooperative and appears stated age Head: Normocephalic, without  obvious abnormality, atraumatic Eyes: conj clear, EOMi, PEERLA Ears: normal TM's and external ear canals both ears Nose: Nares normal. Septum midline. Mucosa normal. No drainage or sinus tenderness. Throat: lips, mucosa, and tongue normal; teeth and gums normal. Goiter, larger on the right Neck: no adenopathy, no carotid bruit, no JVD, supple, symmetrical, trachea midline and thyroid not enlarged, symmetric, no tenderness/mass/nodules Back: symmetric, no curvature. ROM normal. No CVA tenderness. Lungs: clear to auscultation bilaterally Breasts: normal appearance, no masses or tenderness Heart: regular rate and rhythm, S1, S2 normal, no murmur, click, rub or gallop Abdomen: soft, non-tender; bowel sounds normal; no masses,  no organomegaly Extremities: extremities normal, atraumatic, no cyanosis or edema Pulses: 2+ and symmetric Skin: Skin color, texture,  turgor normal. No rashes or lesions Lymph nodes: Cervical, supraclavicular, and axillary nodes normal. Neurologic: Alert and oriented X 3, normal strength and tone. Normal symmetric reflexes. Normal coordination and gait    Assessment:    Healthy female exam.     Plan:     See After Visit Summary for Counseling Recommendations    Keep up a regular exercise program and make sure you are eating a healthy diet Try to eat 4 servings of dairy a day, or if you are lactose intolerant take a calcium with vitamin D daily.  Your vaccines are up to date.   Abnormal TSH -recheck levels today.  Make sure to send a copy to Dr. Rennis ChrisLamberth her endocrinologist.  Heavy periods -we discussed options.  We could certainly try a round of birth control for several months to try to get her cycle re-regulated.  Probably about a 5674-month trial and at that point to have her thyroid is better regulated then we may build to take her off the birth control and see if this helps control her cycle.  If not then she will need additional work-up with pelvic ultrasound.

## 2018-04-27 LAB — COMPLETE METABOLIC PANEL WITH GFR
AG RATIO: 1.6 (calc) (ref 1.0–2.5)
ALT: 13 U/L (ref 6–29)
AST: 14 U/L (ref 10–30)
Albumin: 4.6 g/dL (ref 3.6–5.1)
Alkaline phosphatase (APISO): 99 U/L (ref 33–115)
BUN: 11 mg/dL (ref 7–25)
CALCIUM: 9.8 mg/dL (ref 8.6–10.2)
CO2: 27 mmol/L (ref 20–32)
CREATININE: 0.86 mg/dL (ref 0.50–1.10)
Chloride: 104 mmol/L (ref 98–110)
GFR, EST AFRICAN AMERICAN: 100 mL/min/{1.73_m2} (ref >=60)
GFR, EST NON AFRICAN AMERICAN: 86 mL/min/{1.73_m2} (ref >=60)
GLOBULIN: 2.9 g/dL (ref 1.9–3.7)
Glucose, Bld: 94 mg/dL (ref 65–99)
POTASSIUM: 4.2 mmol/L (ref 3.5–5.3)
Sodium: 140 mmol/L (ref 135–146)
TOTAL PROTEIN: 7.5 g/dL (ref 6.1–8.1)
Total Bilirubin: 0.5 mg/dL (ref 0.2–1.2)

## 2018-04-27 LAB — CBC
HCT: 41.9 % (ref 35.0–45.0)
Hemoglobin: 14 g/dL (ref 11.7–15.5)
MCH: 29.1 pg (ref 27.0–33.0)
MCHC: 33.4 g/dL (ref 32.0–36.0)
MCV: 87.1 fL (ref 80.0–100.0)
MPV: 12.7 fL — ABNORMAL HIGH (ref 7.5–12.5)
Platelets: 239 10*3/uL (ref 140–400)
RBC: 4.81 10*6/uL (ref 3.80–5.10)
RDW: 12.2 % (ref 11.0–15.0)
WBC: 5.5 10*3/uL (ref 3.8–10.8)

## 2018-04-27 LAB — LIPID PANEL
CHOL/HDL RATIO: 5.3 (calc) — AB (ref <5.0)
Cholesterol: 253 mg/dL — ABNORMAL HIGH (ref <200)
HDL: 48 mg/dL — ABNORMAL LOW (ref >50)
LDL Cholesterol (Calc): 181 mg/dL (calc) — ABNORMAL HIGH
Non-HDL Cholesterol (Calc): 205 mg/dL (calc) — ABNORMAL HIGH (ref <130)
Triglycerides: 112 mg/dL (ref <150)

## 2018-04-27 LAB — TSH: TSH: 3.08 mIU/L

## 2018-07-05 ENCOUNTER — Encounter: Payer: Self-pay | Admitting: Family Medicine

## 2018-07-06 ENCOUNTER — Ambulatory Visit (INDEPENDENT_AMBULATORY_CARE_PROVIDER_SITE_OTHER): Payer: BLUE CROSS/BLUE SHIELD

## 2018-07-06 ENCOUNTER — Ambulatory Visit (INDEPENDENT_AMBULATORY_CARE_PROVIDER_SITE_OTHER): Payer: BLUE CROSS/BLUE SHIELD | Admitting: Physician Assistant

## 2018-07-06 ENCOUNTER — Encounter: Payer: Self-pay | Admitting: Physician Assistant

## 2018-07-06 VITALS — BP 135/87 | HR 100 | Temp 98.2°F | Wt 128.0 lb

## 2018-07-06 DIAGNOSIS — N939 Abnormal uterine and vaginal bleeding, unspecified: Secondary | ICD-10-CM

## 2018-07-06 DIAGNOSIS — Z8639 Personal history of other endocrine, nutritional and metabolic disease: Secondary | ICD-10-CM | POA: Diagnosis not present

## 2018-07-06 MED ORDER — MEGESTROL ACETATE 40 MG PO TABS: ORAL_TABLET | ORAL | 0 refills | Status: DC

## 2018-07-06 NOTE — Progress Notes (Signed)
   Subjective:    Patient ID: Tina Pearson, female    DOB: 03-20-1981, 38 y.o.   MRN: [id number]  HPI  Pt is a 38 yo female who presents to the clinic to discuss abnormal bleeding with clots and abdominal cramping. 10 weeks ago she was started on OCP for DUB. She was started on 3 month extended release OCP. She has tolerated well until Friday night she started bleeding about a week before she should. She has bleed since Friday with Monday becoming very heavy and passing multiple clots. She brings in pictures to show clots. She states " these clots were bigger than when she miscarried at 10 weeks". She has minimal lower abdominal cramping. No vaginal discharge, itching, lower abdominal pain, dysuria. She did have some intermittent left flank pain. She comes in to be evaluated. Pt has had some post-partum issues with thyroid but have normalized on their own. Last checked 2 months ago.   .. Active Ambulatory Problems    Diagnosis Date Noted  . PURE HYPERCHOLESTEROLEMIA 02/25/2010  . Migraine headache 02/25/2010  . ALLERGIC RHINITIS 03/08/2010  . IRREGULAR MENSTRUAL CYCLE 02/25/2010  . Goiter 01/31/2014  . History of thyroiditis 05/29/2015  . Thyroid nodule 09/05/2013   Resolved Ambulatory Problems    Diagnosis Date Noted  . SINUSITIS - ACUTE-NOS 03/25/2010   Past Medical History:  Diagnosis Date  . Hyperlipidemia   . Migraines   . Post partum thyroiditis        Review of Systems    see HPI.  Objective:   Physical Exam Vitals signs reviewed.  Constitutional:      Appearance: Normal appearance.  HENT:     Head: Normocephalic and atraumatic.  Cardiovascular:     Rate and Rhythm: Regular rhythm. Tachycardia present.     Pulses: Normal pulses.  Pulmonary:     Effort: Pulmonary effort is normal.     Breath sounds: Normal breath sounds.  Abdominal:     General: Abdomen is flat. Bowel sounds are normal. There is no distension.     Palpations: Abdomen is soft.     Tenderness:  There is no abdominal tenderness. There is no right CVA tenderness, left CVA tenderness, guarding or rebound.  Neurological:     Mental Status: She is alert.  Psychiatric:        Mood and Affect: Mood normal.           Assessment & Plan:  Marland KitchenMarland KitchenJennessa was seen today for menorrhagia.  Diagnoses and all orders for this visit:  Abnormal uterine bleeding -     CBC with Differential/Platelet -     Ferritin -     TSH -     B-HCG Quant -     US PELVIS (TRANSABDOMINAL ONLY) -     US PELVIS TRANSVANGINAL NON-OB (TV ONLY) -     megestrol (MEGACE) 40 MG tablet; Take one tablet twice a day until bleeding slows then decrease to once a day until it stops.   Certainly were some sizeable clots passed. Start megace until stops bleeding. Continue on seasonale. Will get pelvic/transvaginal US today. Will check for anemia and confirm TSH has stayed stable. Will get HCG to confirm she was not pregnant.  Reassured her that her vitals look good today.   Suggest follow up with PCP in 1-2 weeks to discuss changing OCP if this continues to happen.

## 2018-07-06 NOTE — Patient Instructions (Signed)
Abnormal Uterine Bleeding  Abnormal uterine bleeding means bleeding more than usual from your uterus. It can include:   Bleeding between periods.   Bleeding after sex.   Bleeding that is heavier than normal.   Periods that last longer than usual.   Bleeding after you have stopped having your period (menopause).  There are many problems that may cause this. You should see a doctor for any kind of bleeding that is not normal. Treatment depends on the cause of the bleeding.  Follow these instructions at home:   Watch your condition for any changes.   Do not use tampons, douche, or have sex, if your doctor tells you not to.   Change your pads often.   Get regular well-woman exams. Make sure they include a pelvic exam and cervical cancer screening.   Keep all follow-up visits as told by your doctor. This is important.  Contact a doctor if:   The bleeding lasts more than one week.   You feel dizzy at times.   You feel like you are going to throw up (nauseous).   You throw up.  Get help right away if:   You pass out.   You have to change pads every hour.   You have belly (abdominal) pain.   You have a fever.   You get sweaty.   You get weak.   You passing large blood clots from your vagina.  Summary   Abnormal uterine bleeding means bleeding more than usual from your uterus.   There are many problems that may cause this. You should see a doctor for any kind of bleeding that is not normal.   Treatment depends on the cause of the bleeding.  This information is not intended to replace advice given to you by your health care provider. Make sure you discuss any questions you have with your health care provider.  Document Released: 02/16/2009 Document Revised: 04/15/2016 Document Reviewed: 04/15/2016  Elsevier Interactive Patient Education  2019 Elsevier Inc[employer]

## 2018-07-07 LAB — CBC WITH DIFFERENTIAL/PLATELET
ABSOLUTE MONOCYTES: 451 {cells}/uL (ref 200–950)
BASOS PCT: 0.4 %
Basophils Absolute: 39 cells/uL (ref 0–200)
Eosinophils Absolute: 59 cells/uL (ref 15–500)
Eosinophils Relative: 0.6 %
HCT: 42.7 % (ref 35.0–45.0)
Hemoglobin: 14.1 g/dL (ref 11.7–15.5)
Lymphs Abs: 2283 cells/uL (ref 850–3900)
MCH: 29 pg (ref 27.0–33.0)
MCHC: 33 g/dL (ref 32.0–36.0)
MCV: 87.7 fL (ref 80.0–100.0)
MPV: 12.9 fL — ABNORMAL HIGH (ref 7.5–12.5)
Monocytes Relative: 4.6 %
Neutro Abs: 6968 cells/uL (ref 1500–7800)
Neutrophils Relative %: 71.1 %
Platelets: 249 10*3/uL (ref 140–400)
RBC: 4.87 10*6/uL (ref 3.80–5.10)
RDW: 12.1 % (ref 11.0–15.0)
Total Lymphocyte: 23.3 %
WBC: 9.8 10*3/uL (ref 3.8–10.8)

## 2018-07-07 LAB — FERRITIN: Ferritin: 30 ng/mL (ref 16–154)

## 2018-07-07 LAB — HCG, QUANTITATIVE, PREGNANCY: HCG, Total, QN: <2 m[IU]/mL

## 2018-07-07 LAB — TSH: TSH: 3 mIU/L

## 2018-07-07 NOTE — Progress Notes (Signed)
No fibroids!  Endometrium lining normal at 71mm.  Unremarkable pelvic ultrasound.   Take megace until bleeding stops. Stay on birth control. Alert [place] with any more abnormal bleeding.

## 2018-07-07 NOTE — Progress Notes (Signed)
Call pt: not anemic!  Not pregnant! Thyroid stable and looks good.

## 2018-09-26 ENCOUNTER — Encounter: Payer: Self-pay | Admitting: Family Medicine

## 2018-09-26 DIAGNOSIS — N920 Excessive and frequent menstruation with regular cycle: Secondary | ICD-10-CM

## 2018-09-28 MED ORDER — LEVONORGEST-ETH ESTRAD 91-DAY 0.15-0.03 MG PO TABS: 1.0000 | ORAL_TABLET | Freq: Every day | ORAL | 1 refills | Status: DC

## 2018-10-01 ENCOUNTER — Ambulatory Visit: Payer: BLUE CROSS/BLUE SHIELD | Admitting: Family Medicine

## 2018-10-01 ENCOUNTER — Encounter: Payer: Self-pay | Admitting: Family Medicine

## 2018-10-01 ENCOUNTER — Ambulatory Visit (INDEPENDENT_AMBULATORY_CARE_PROVIDER_SITE_OTHER): Payer: BLUE CROSS/BLUE SHIELD

## 2018-10-01 ENCOUNTER — Other Ambulatory Visit: Payer: Self-pay

## 2018-10-01 VITALS — BP 135/90 | HR 94 | Ht 63.0 in | Wt 127.0 lb

## 2018-10-01 DIAGNOSIS — M25512 Pain in left shoulder: Secondary | ICD-10-CM

## 2018-10-01 DIAGNOSIS — M898X1 Other specified disorders of bone, shoulder: Secondary | ICD-10-CM

## 2018-10-01 DIAGNOSIS — M5412 Radiculopathy, cervical region: Secondary | ICD-10-CM | POA: Insufficient documentation

## 2018-10-01 DIAGNOSIS — M7552 Bursitis of left shoulder: Secondary | ICD-10-CM | POA: Diagnosis not present

## 2018-10-01 IMAGING — DX LEFT SHOULDER - 2+ VIEW
3 series · 3 of 3 positions shown · non-contrast
Comparison: None

CLINICAL DATA: Left shoulder pain for 1 week

EXAM:
LEFT SHOULDER - 2+ VIEW

[shoulder grashey]
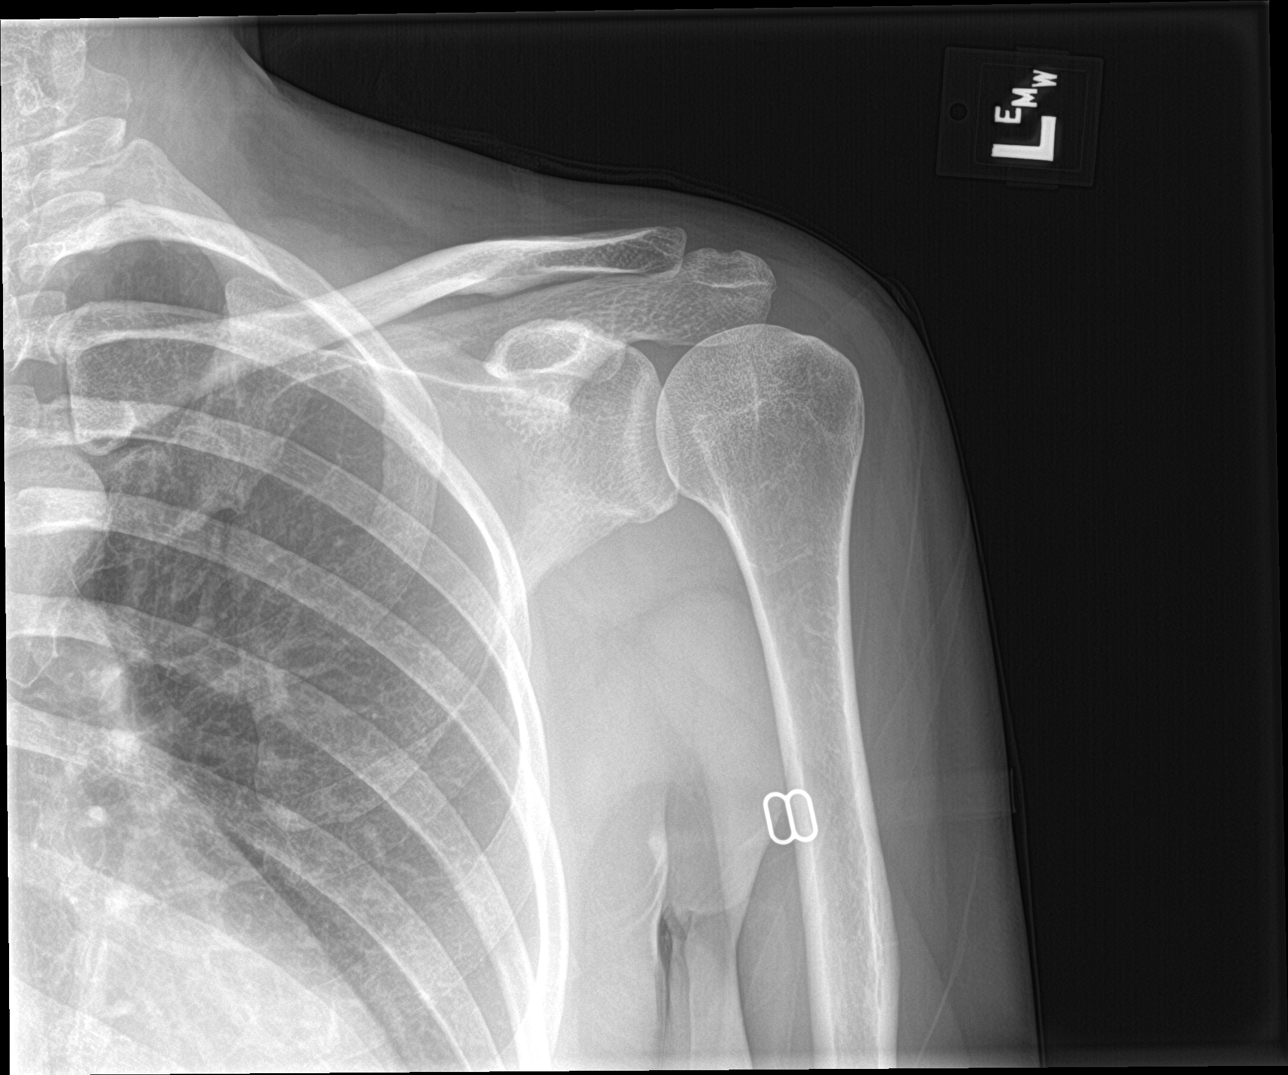

[shoulder y view]
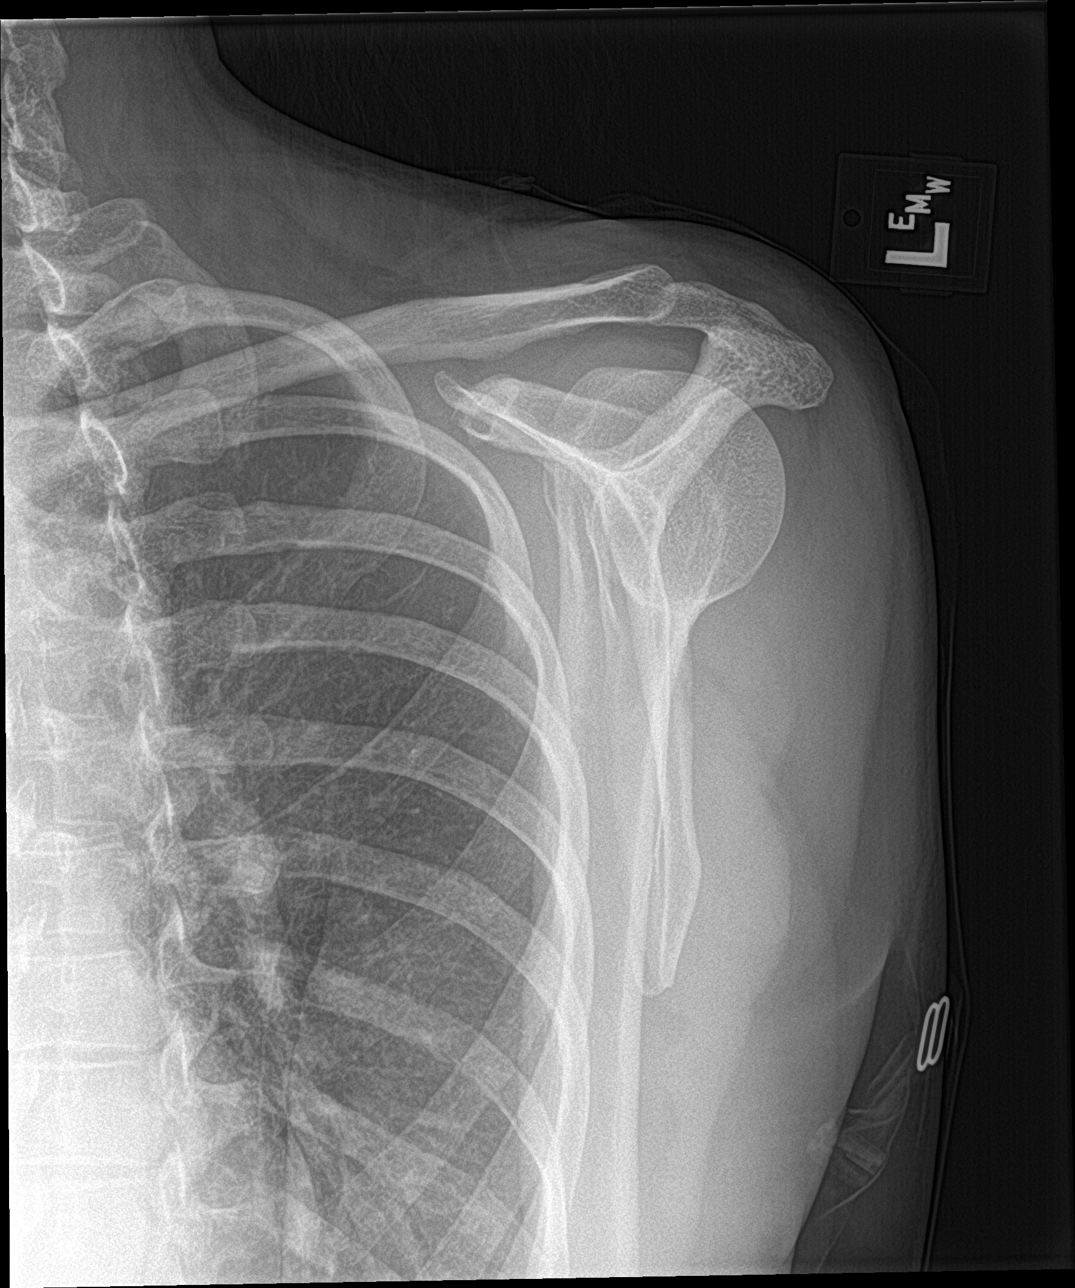

[shoulder axillary]
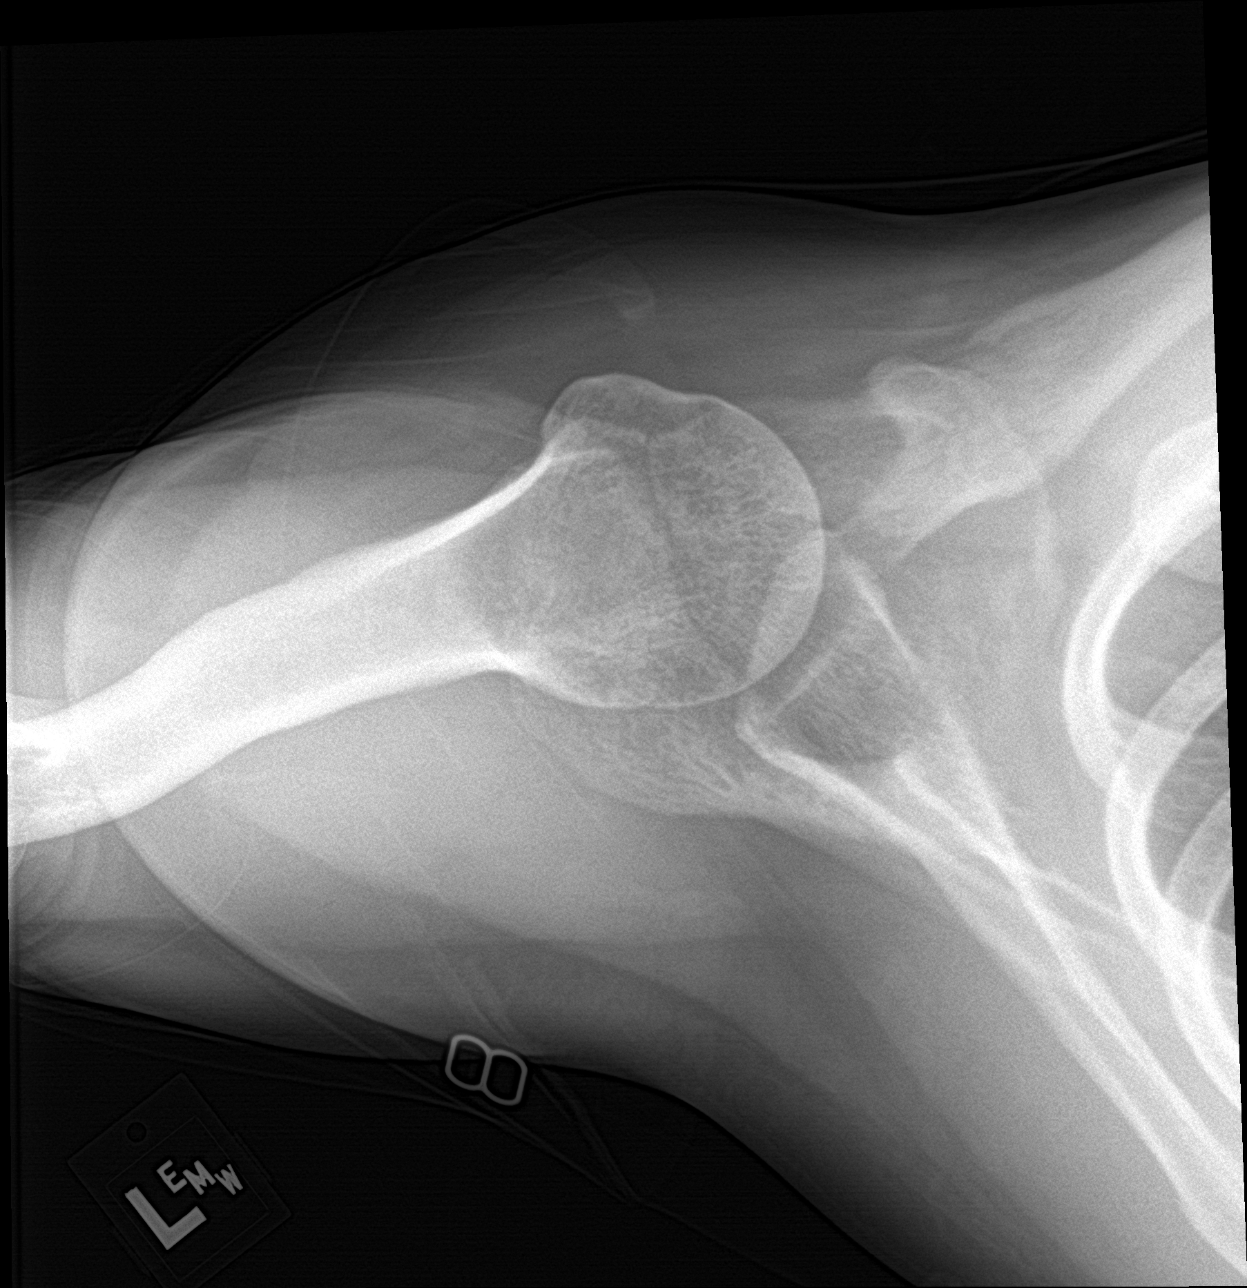

[3 of 3 positions shown; findings below may reference images not displayed]

FINDINGS: No acute fracture. No dislocation.  Unremarkable soft tissues.
IMPRESSION: No acute bony pathology.

## 2018-10-01 IMAGING — DX LEFT CLAVICLE - 2+ VIEWS
2 series · 2 of 2 positions shown · non-contrast
Comparison: None.

CLINICAL DATA: Left shoulder pain and tenderness for a week. No
trauma.

EXAM:
LEFT CLAVICLE - 2+ VIEWS

[clavicle ap]
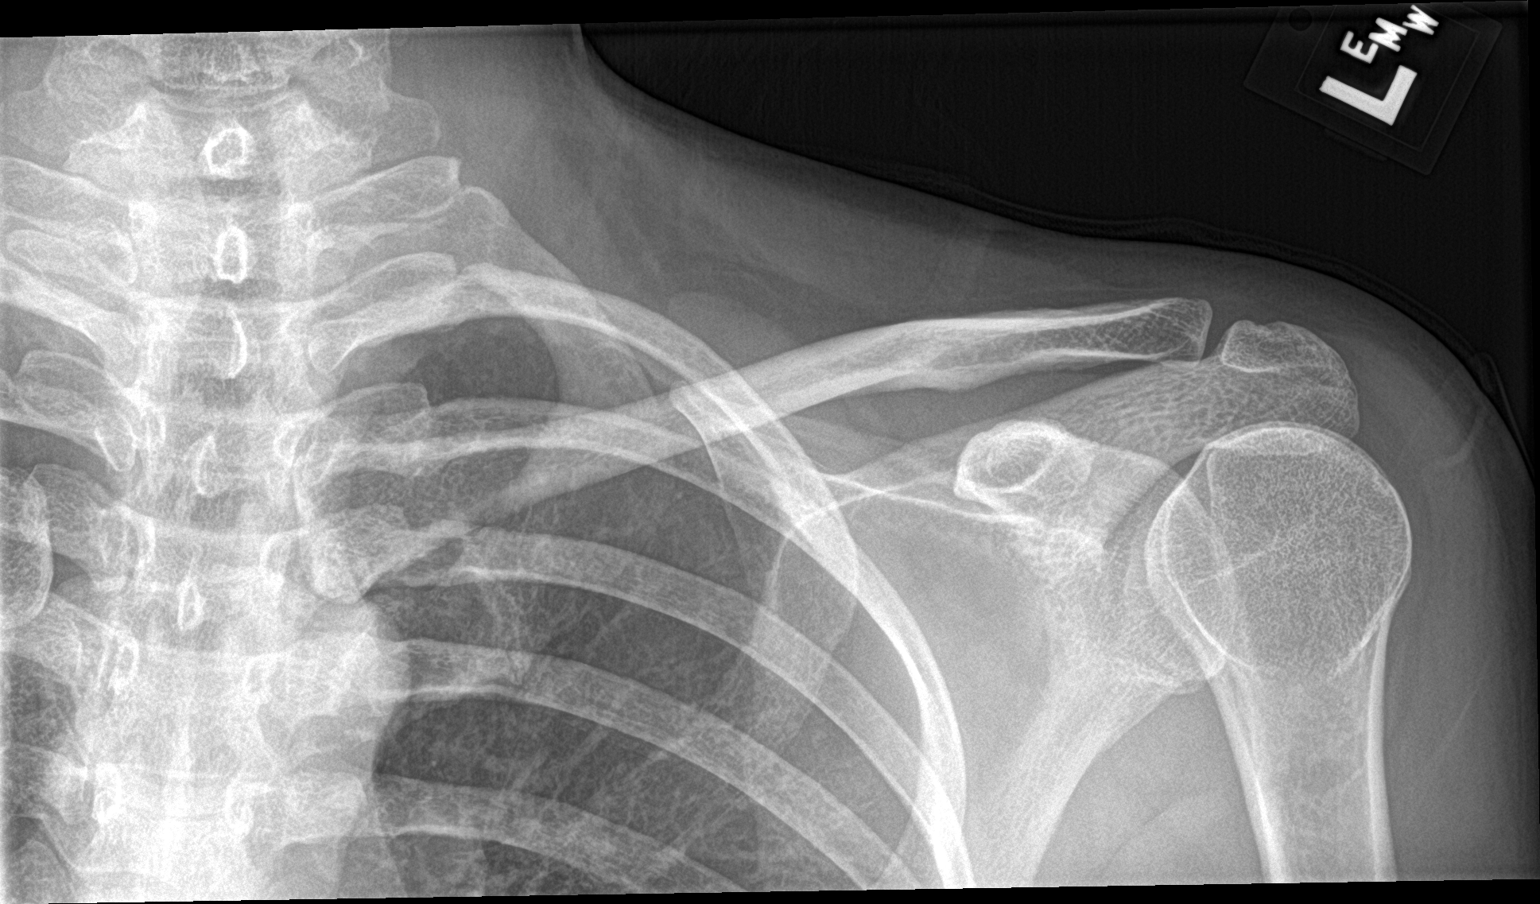

[clavicle axial]
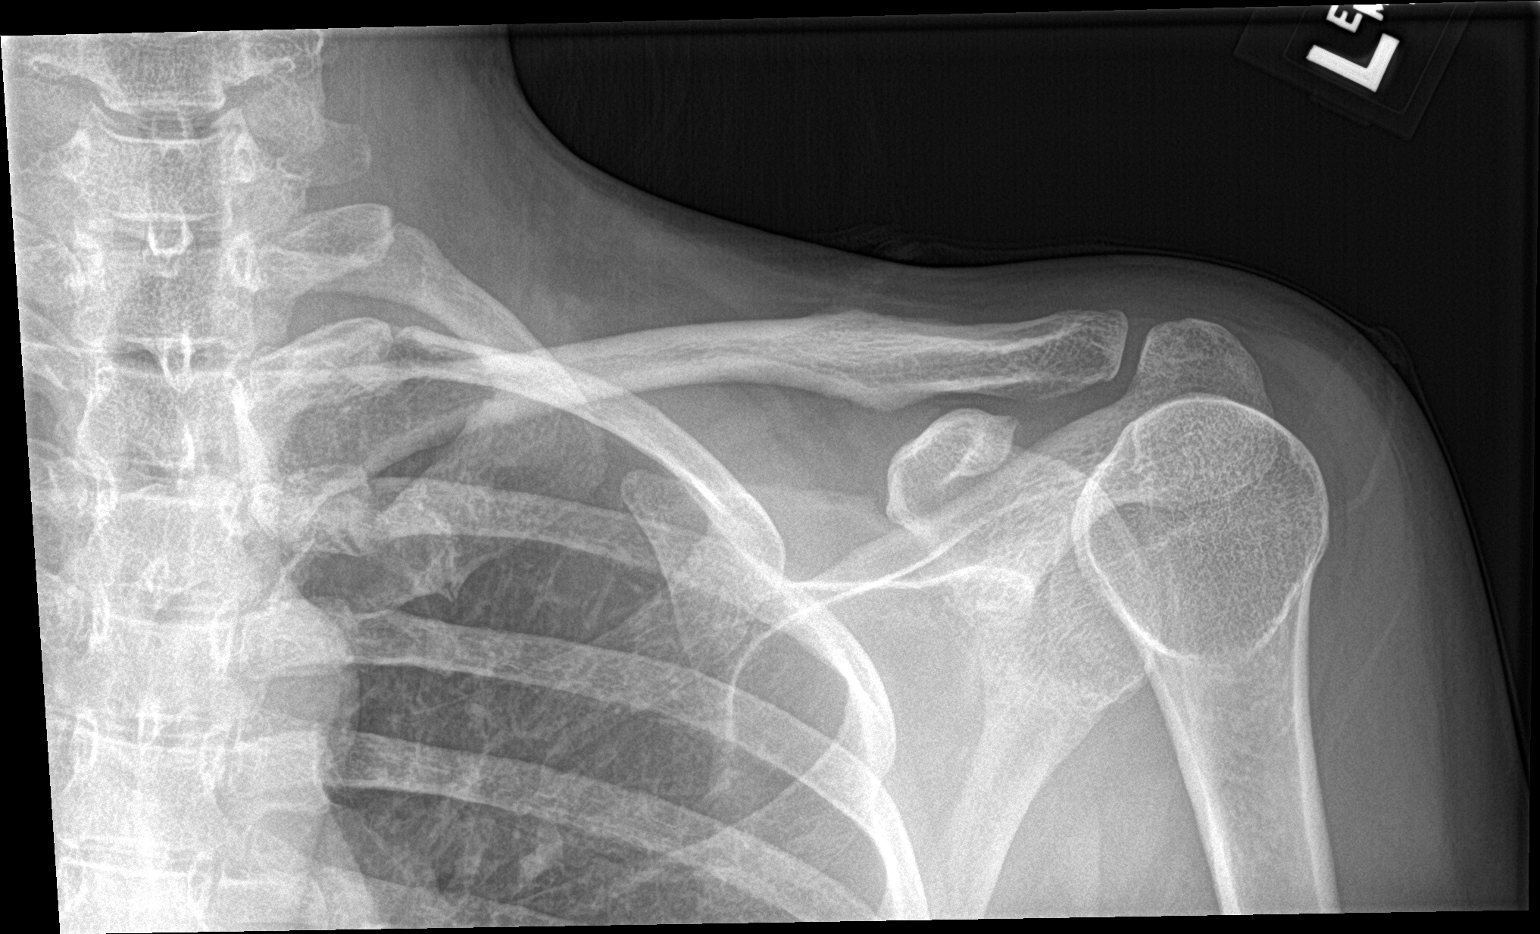

[2 of 2 positions shown; findings below may reference images not displayed]

FINDINGS: There is no evidence of fracture or other focal bone lesions. Soft
tissues are unremarkable.
IMPRESSION: Negative.

## 2018-10-01 MED ORDER — MELOXICAM 15 MG PO TABS: ORAL_TABLET | ORAL | 3 refills | Status: DC

## 2018-10-01 NOTE — Progress Notes (Signed)
Acute Office Visit  Subjective:    Patient ID: Tina Pearson, female    DOB: [birth year], 38 y.o.   MRN: [id number]  Chief Complaint  Patient presents with  . Shoulder Pain    L shoulder x 1 wk denies injury or trauma, using ice/heat IBU and tylenol pain is worse at night    HPI Patient is in today for left shoulder pain x 1 week.  Nuys any known injury or trauma to the area.  She has been lifting her 727-month-old and says even that is so painful she can barely do it.  She has been using ibuprofen and Tylenol without relief.  She is also been using heat and ice without significant improvement.  Most of her pain is over the anterior part of the shoulder.  She says it is been popping with arm movement.  She says he has a hard time reaching over to the opposite shoulder and reaching up to her bra strap area when she reaches behind her back.    Past Medical History:  Diagnosis Date  . Hyperlipidemia   . Migraines   . Post partum thyroiditis    Dr. Morrison OldLambeth    Past Surgical History:  Procedure Laterality Date  . VEIN SURGERY Right 01/2015   [facility].   . WISDOM TOOTH EXTRACTION      Family History  Problem Relation Age of Onset  . Heart attack Father   . Hyperlipidemia Father   . Heart attack Paternal Grandfather   . Hyperlipidemia Mother   . Stroke Paternal Grandfather     Social History   Socioeconomic History  . Marital status: Married    Spouse name: Not on file  . Number of children: 1  . Years of education: Not on file  . Highest education level: Not on file  Occupational History  . 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 Financial resource strain: Not on file  . Food insecurity:    Worry: Not on file    Inability: Not on file  . Transportation needs:    Medical: Not on file    Non-medical: Not on file  Tobacco Use  . Smoking status: Never Smoker  . Smokeless tobacco: Never Used  Substance and Sexual Activity  . Alcohol use: Not on file     Comment: rare  . Drug use: Not on file  . Sexual activity: Not on file    Comment: Occupational psychologistmarketing coordinator, runs 2 X week, eats healthy, married, no kids.  Lifestyle  . Physical activity:    Days per week: Not on file    Minutes per session: Not on file  . Stress: Not on file  Relationships  . Social connections:    Talks on phone: Not on file    Gets together: Not on file    Attends religious service: Not on file    Active member of club or organization: Not on file    Attends meetings of clubs or organizations: Not on file    Relationship status: Not on file  . Intimate partner violence:    Fear of current or ex partner: Not on file    Emotionally abused: Not on file    Physically abused: Not on file    Forced sexual activity: Not on file  Other Topics Concern  . Not on file  Social History Narrative   Some exercise. Drinks caffeine daily.     Outpatient Medications Prior to Visit  Medication Sig Dispense Refill  . EPINEPHrine 0.3 mg/0.3 mL IJ SOAJ injection Inject into the skin.    . frovatriptan (FROVA) 2.5 MG tablet Take 1 tablet (2.5 mg total) by mouth daily as needed for migraine. If recurs, may repeat after 2 hours. Max of 3 tabs in 24 hours. 27 tablet 1  . levonorgestrel-ethinyl estradiol (SEASONALE) 0.15-0.03 MG tablet Take 1 tablet by mouth daily. 3 Package 1  . meclizine (ANTIVERT) 25 MG tablet Take 1 tablet (25 mg total) by mouth 3 (three) times daily as needed for dizziness or nausea. 30 tablet 3  . diazepam (VALIUM) 5 MG tablet Take 1 tablet (5 mg total) by mouth every 12 (twelve) hours as needed (dizzy). 10 tablet 1  . megestrol (MEGACE) 40 MG tablet Take one tablet twice a day until bleeding slows then decrease to once a day until it stops. 30 tablet 0   No facility-administered medications prior to visit.     Allergies  Allergen Reactions  . Sumatriptan Nausea And Vomiting  . Yellow Jacket Venom [Bee Venom] Swelling and Rash    ROS     Objective:     Physical Exam  Constitutional: She is oriented to person, place, and time. She appears well-developed and well-nourished.  HENT:  Head: Normocephalic and atraumatic.  Eyes: Conjunctivae and EOM are normal.  Cardiovascular: Normal rate.  Pulmonary/Chest: Effort normal.  Musculoskeletal:     Comments: Left shoulder is tender near the [facility] joint but she is also somewhat tender over the distal and middle clavicle as well.  Nontender over the scapula.  She is able to lift her arm to 180 degrees without significant difficulty.  She did have pain with reaching over.  Normal external and internal rotation.  Neurological: She is alert and oriented to person, place, and time.  Skin: Skin is dry. No pallor.  Psychiatric: She has a normal mood and affect. Her behavior is normal.  Vitals reviewed.   BP 135/90   Pulse 94   Ht 5\' 3"  (1.6 m)   Wt 127 lb (57.6 kg)   SpO2 99%   BMI 22.50 kg/m  Wt Readings from Last 3 Encounters:  10/01/18 127 lb (57.6 kg)  07/06/18 128 lb (58.1 kg)  04/26/18 123 lb (55.8 kg)    There are no preventive care reminders to display for this patient.  There are no preventive care reminders to display for this patient.   Lab Results  Component Value Date   TSH 3.00 07/06/2018   Lab Results  Component Value Date   WBC 9.8 07/06/2018   HGB 14.1 07/06/2018   HCT 42.7 07/06/2018   MCV 87.7 07/06/2018   PLT 249 07/06/2018   Lab Results  Component Value Date   NA 140 04/26/2018   K 4.2 04/26/2018   CO2 27 04/26/2018   GLUCOSE 94 04/26/2018   BUN 11 04/26/2018   CREATININE 0.86 04/26/2018   BILITOT 0.5 04/26/2018   ALKPHOS 66 07/30/2016   AST 14 04/26/2018   ALT 13 04/26/2018   PROT 7.5 04/26/2018   ALBUMIN 4.2 07/30/2016   CALCIUM 9.8 04/26/2018   Lab Results  Component Value Date   CHOL 253 (H) 04/26/2018   Lab Results  Component Value Date   HDL 48 (L) 04/26/2018   Lab Results  Component Value Date   LDLCALC 181 (H) 04/26/2018   Lab  Results  Component Value Date   TRIG 112 04/26/2018   Lab Results  Component Value Date  CHOLHDL 5.3 (H) 04/26/2018   No results found for: HGBA1C     Assessment & Plan:   Problem List Items Addressed This Visit      Musculoskeletal and Integument   Acute bursitis of left shoulder - Primary    Subacromial injection, meloxicam, rehab exercises. X-rays are negative. Return to see me in 1 month.       Other Visit Diagnoses    Acute pain of left shoulder       Relevant Orders   DG Shoulder Left   DG Clavicle Left   Pain of left clavicle       Relevant Orders   DG Shoulder Left   DG Clavicle Left     Left shoulder pain-because of her level of pain required x-rays today for further evaluation.  No findings of shoulder separation or fracture.  Exam and history most consistent with bursitis versus tendinitis.  Patient offered further evaluation and injection with Dr. Rodney Langton today.   No orders of the defined types were placed in this encounter.    Nani Gasser, MD

## 2018-10-01 NOTE — Addendum Note (Signed)
Addended by: Monica Becton on: 10/01/2018 08:15 PM   Modules accepted: Orders

## 2018-10-01 NOTE — Progress Notes (Signed)
Subjective:    I'm seeing this patient as a consultation for: Dr. Nani Gasser  CC: Left shoulder pain  HPI: 4 weeks now this pleasant 38 year old female has had severe pain in her left shoulder, localized over the deltoid, worse with overhead activities, keeping her up at night.  No trauma.  I reviewed the past medical history, family history, social history, surgical history, and allergies today and no changes were needed.  Please see the problem list section below in epic for further details.  Past Medical History: Past Medical History:  Diagnosis Date  . Hyperlipidemia   . Migraines   . Post partum thyroiditis    Dr. Morrison Old   Past Surgical History: Past Surgical History:  Procedure Laterality Date  . VEIN SURGERY Right 01/2015   [facility].   . WISDOM TOOTH EXTRACTION     Social History: Social History   Socioeconomic History  . Marital status: Married    Spouse name: Not on file  . Number of children: 1  . Years of education: Not on file  . Highest education level: Not on file  Occupational History  . Occupation: Therapist, music  . Financial resource strain: Not on file  . Food insecurity:    Worry: Not on file    Inability: Not on file  . Transportation needs:    Medical: Not on file    Non-medical: Not on file  Tobacco Use  . Smoking status: Never Smoker  . Smokeless tobacco: Never Used  Substance and Sexual Activity  . Alcohol use: Not on file    Comment: rare  . Drug use: Not on file  . Sexual activity: Not on file    Comment: Occupational psychologist, runs 2 X week, eats healthy, married, no kids.  Lifestyle  . Physical activity:    Days per week: Not on file    Minutes per session: Not on file  . Stress: Not on file  Relationships  . Social connections:    Talks on phone: Not on file    Gets together: Not on file    Attends religious service: Not on file    Active member of club or organization: Not on file   Attends meetings of clubs or organizations: Not on file    Relationship status: Not on file  Other Topics Concern  . Not on file  Social History Narrative   Some exercise. Drinks caffeine daily.    Family History: Family History  Problem Relation Age of Onset  . Heart attack Father   . Hyperlipidemia Father   . Heart attack Paternal Grandfather   . Hyperlipidemia Mother   . Stroke Paternal Grandfather    Allergies: Allergies  Allergen Reactions  . Sumatriptan Nausea And Vomiting  . Yellow Jacket Venom [Bee Venom] Swelling and Rash   Medications: See med rec.  Review of Systems: No headache, visual changes, nausea, vomiting, diarrhea, constipation, dizziness, abdominal pain, skin rash, fevers, chills, night sweats, weight loss, swollen lymph nodes, body aches, joint swelling, muscle aches, chest pain, shortness of breath, mood changes, visual or auditory hallucinations.   Objective:   General: Well Developed, well nourished, and in no acute distress.  Neuro:  Extra-ocular muscles intact, able to move all 4 extremities, sensation grossly intact.  Deep tendon reflexes tested were normal. Psych: Alert and oriented, mood congruent with affect. ENT:  Ears and nose appear unremarkable.  Hearing grossly normal. Neck: Unremarkable overall appearance, trachea midline.  No visible thyroid enlargement. Eyes: Conjunctivae and lids appear unremarkable.  Pupils equal and round. Skin: Warm and dry, no rashes noted.  Cardiovascular: Pulses palpable, no extremity edema. Left shoulder: Inspection reveals no abnormalities, atrophy or asymmetry. Palpation is normal with no tenderness over AC joint or bicipital groove. ROM is full in all planes. Rotator cuff strength normal throughout. Positive Neer and Hawkin's tests, empty can. Speeds and Yergason's tests normal. No labral pathology noted with negative Obrien's, negative crank, negative clunk, and good stability. Normal scapular function  observed. No painful arc and no drop arm sign. No apprehension sign  Procedure: Real-time Ultrasound Guided injection of the left subacromial bursa Device: GE Logiq E  Verbal informed consent obtained.  Time-out conducted.  Noted no overlying erythema, induration, or other signs of local infection.  Skin prepped in a sterile fashion.  Local anesthesia: Topical Ethyl chloride.  With sterile technique and under real time ultrasound guidance:  1 cc Kenalog 40, 1 cc lidocaine, 1 cc bupivacaine injected easily Completed without difficulty  Pain immediately resolved suggesting accurate placement of the medication.  Advised to call if fevers/chills, erythema, induration, drainage, or persistent bleeding.  Images permanently stored and available for review in the ultrasound unit.  Impression: Technically successful ultrasound guided injection.  Impression and Recommendations:   This case required medical decision making of moderate complexity.  Acute bursitis of left shoulder Subacromial injection, meloxicam, rehab exercises. X-rays are negative. Return to see me in 1 month.   ___________________________________________ Ihor Austinhomas J. Benjamin Stainhekkekandam, M.D., ABFM., CAQSM. Primary Care and Sports Medicine [facility]  Adjunct Professor of Family Medicine  [employer]

## 2018-10-01 NOTE — Assessment & Plan Note (Signed)
Subacromial injection, meloxicam, rehab exercises. X-rays are negative. Return to see me in 1 month.

## 2018-10-01 NOTE — Patient Instructions (Addendum)
You also saw Dr. Rodney Langton today and he gave you an injection of triamcinolone into the shoulder bursa.   Recommend that you ice the area for 5 to 10 minutes this evening. You to start your exercises tomorrow.   If at any point you decide you want to do more formal physical therapy then please just contact our office.

## 2018-11-01 ENCOUNTER — Encounter: Payer: Self-pay | Admitting: Sports Medicine

## 2018-11-01 ENCOUNTER — Ambulatory Visit (INDEPENDENT_AMBULATORY_CARE_PROVIDER_SITE_OTHER): Payer: BC Managed Care – PPO | Admitting: Sports Medicine

## 2018-11-01 DIAGNOSIS — M7552 Bursitis of left shoulder: Secondary | ICD-10-CM | POA: Diagnosis not present

## 2018-11-01 NOTE — Progress Notes (Signed)
Subjective:    CC: Left shoulder pain  HPI: Clinically diagnosed as a left impingement syndrome, injected the last visit, over 60% better, with pain only intermittent now.  I reviewed the past medical history, family history, social history, surgical history, and allergies today and no changes were needed.  Please see the problem list section below in epic for further details.  Past Medical History: Past Medical History:  Diagnosis Date  . Hyperlipidemia   . Migraines   . Post partum thyroiditis    Dr. Steffanie Dunn   Past Surgical History: Past Surgical History:  Procedure Laterality Date  . VEIN SURGERY Right 01/2015   [place] [facility].   . WISDOM TOOTH EXTRACTION     Social History: Social History   Socioeconomic History  . Marital status: Married    Spouse name: Not on file  . Number of children: 1  . Years of education: Not on file  . Highest education level: Not on file  Occupational History  . Occupation: Psychologist, educational  . Financial resource strain: Not on file  . Food insecurity    Worry: Not on file    Inability: Not on file  . Transportation needs    Medical: Not on file    Non-medical: Not on file  Tobacco Use  . Smoking status: Never Smoker  . Smokeless tobacco: Never Used  Substance and Sexual Activity  . Alcohol use: Not on file    Comment: rare  . Drug use: Not on file  . Sexual activity: Not on file    Comment: Copy, runs 2 X week, eats healthy, married, no kids.  Lifestyle  . Physical activity    Days per week: Not on file    Minutes per session: Not on file  . Stress: Not on file  Relationships  . Social Herbalist on phone: Not on file    Gets together: Not on file    Attends religious service: Not on file    Active member of club or organization: Not on file    Attends meetings of clubs or organizations: Not on file    Relationship status: Not on file  Other Topics Concern  . Not on file   Social History Narrative   Some exercise. Drinks caffeine daily.    Family History: Family History  Problem Relation Age of Onset  . Heart attack Father   . Hyperlipidemia Father   . Heart attack Paternal Grandfather   . Hyperlipidemia Mother   . Stroke Paternal Grandfather    Allergies: Allergies  Allergen Reactions  . Sumatriptan Nausea And Vomiting  . Yellow Jacket Venom [Bee Venom] Swelling and Rash   Medications: See med rec.  Review of Systems: No fevers, chills, night sweats, weight loss, chest pain, or shortness of breath.   Objective:    General: Well Developed, well nourished, and in no acute distress.  Neuro: Alert and oriented x3, extra-ocular muscles intact, sensation grossly intact.  HEENT: Normocephalic, atraumatic, pupils equal round reactive to light, neck supple, no masses, no lymphadenopathy, thyroid nonpalpable.  Skin: Warm and dry, no rashes. Cardiac: Regular rate and rhythm, no murmurs rubs or gallops, no lower extremity edema.  Respiratory: Clear to auscultation bilaterally. Not using accessory muscles, speaking in full sentences. Left shoulder: Inspection reveals no abnormalities, atrophy or asymmetry. Palpation is normal with no tenderness over AC joint or bicipital groove. ROM is full in all planes. Rotator cuff strength normal  throughout. No signs of impingement with negative Neer and Hawkin's tests, empty can. Speeds and Yergason's tests normal. No labral pathology noted with negative Obrien's, negative crank, negative clunk, and good stability. Normal scapular function observed. No painful arc and no drop arm sign. No apprehension sign  Impression and Recommendations:    Acute bursitis of left shoulder Much better after subacromial injection at the last visit. She continues with her rehab exercises, still has a bit of intermittent discomfort but continuing to improve. Continue rehab exercises indefinitely, return to see me as needed.    ___________________________________________ Ihor Austinhomas J. Benjamin Stainhekkekandam, M.D., ABFM., CAQSM. Primary Care and Sports Medicine [facility]  Adjunct Professor of Family Medicine  [employer]

## 2018-11-01 NOTE — Assessment & Plan Note (Signed)
Much better after subacromial injection at the last visit. She continues with her rehab exercises, still has a bit of intermittent discomfort but continuing to improve. Continue rehab exercises indefinitely, return to see me as needed.

## 2018-11-08 ENCOUNTER — Ambulatory Visit (INDEPENDENT_AMBULATORY_CARE_PROVIDER_SITE_OTHER): Payer: BC Managed Care – PPO | Admitting: Sports Medicine

## 2018-11-08 ENCOUNTER — Encounter: Payer: Self-pay | Admitting: Sports Medicine

## 2018-11-08 ENCOUNTER — Other Ambulatory Visit: Payer: Self-pay

## 2018-11-08 DIAGNOSIS — M25512 Pain in left shoulder: Secondary | ICD-10-CM

## 2018-11-08 DIAGNOSIS — G8929 Other chronic pain: Secondary | ICD-10-CM | POA: Diagnosis not present

## 2018-11-08 DIAGNOSIS — M7552 Bursitis of left shoulder: Secondary | ICD-10-CM

## 2018-11-08 MED ORDER — HYDROCODONE-ACETAMINOPHEN 5-325 MG PO TABS: 1.0000 | ORAL_TABLET | Freq: Three times a day (TID) | ORAL | 0 refills | Status: DC | PRN

## 2018-11-08 NOTE — Assessment & Plan Note (Addendum)
Subacromial impingement symptoms have resolved. Pain now is referrable to the acromioclavicular joint. This was injected today. Hydrocodone for pain. Return to see me in 1 month, MRI if no better.  Interestingly shoulder MRI is unremarkable, we should probably take a look at her cervical spine as a possible cause of the pain now.  Ideally this would be in the form of an x-ray and an MRI if okay with her.

## 2018-11-08 NOTE — Progress Notes (Addendum)
Subjective:    CC: Left shoulder pain  HPI: Tina Pearson returns, we injected her subacromial bursa at the initial visit, she did well with regards to impingement symptoms, she continued to have pain over the acromioclavicular joint, worse with reaching across her chest.  Moderate, persistent, localized without radiation.  I reviewed the past medical history, family history, social history, surgical history, and allergies today and no changes were needed.  Please see the problem list section below in epic for further details.  Past Medical History: Past Medical History:  Diagnosis Date  . Hyperlipidemia   . Migraines   . Post partum thyroiditis    Dr. Morrison [place]   Past Surgical History: Past Surgical History:  Procedure Laterality Date  . VEIN SURGERY Right 01/2015   [place] [facility].   . WISDOM TOOTH EXTRACTION     Social History: Social History   Socioeconomic History  . Marital status: Married    Spouse name: Not on file  . Number of children: 1  . Years of education: Not on file  . Highest education level: Not on file  Occupational History  . 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 Financial resource strain: Not on file  . Food insecurity    Worry: Not on file    Inability: Not on file  . Transportation needs    Medical: Not on file    Non-medical: Not on file  Tobacco Use  . Smoking status: Never Smoker  . Smokeless tobacco: Never Used  Substance and Sexual Activity  . Alcohol use: Not on file    Comment: rare  . Drug use: Not on file  . Sexual activity: Not on file    Comment: Occupational psychologistmarketing coordinator, runs 2 X week, eats healthy, married, no kids.  Lifestyle  . Physical activity    Days per week: Not on file    Minutes per session: Not on file  . Stress: Not on file  Relationships  . Social Musicianconnections    Talks on phone: Not on file    Gets together: Not on file    Attends religious service: Not on file    Active member of club or organization: Not on  file    Attends meetings of clubs or organizations: Not on file    Relationship status: Not on file  Other Topics Concern  . Not on file  Social History Narrative   Some exercise. Drinks caffeine daily.    Family History: Family History  Problem Relation Age of Onset  . Heart attack Father   . Hyperlipidemia Father   . Heart attack Paternal Grandfather   . Hyperlipidemia Mother   . Stroke Paternal Grandfather    Allergies: Allergies  Allergen Reactions  . Sumatriptan Nausea And Vomiting  . Yellow Jacket Venom [Bee Venom] Swelling and Rash   Medications: See med rec.  Review of Systems: No fevers, chills, night sweats, weight loss, chest pain, or shortness of breath.   Objective:    General: Well Developed, well nourished, and in no acute distress.  Neuro: Alert and oriented x3, extra-ocular muscles intact, sensation grossly intact.  HEENT: Normocephalic, atraumatic, pupils equal round reactive to light, neck supple, no masses, no lymphadenopathy, thyroid nonpalpable.  Skin: Warm and dry, no rashes. Cardiac: Regular rate and rhythm, no murmurs rubs or gallops, no lower extremity edema.  Respiratory: Clear to auscultation bilaterally. Not using accessory muscles, speaking in full sentences. Left shoulder: Inspection reveals no abnormalities, atrophy or asymmetry. ROM  is full in all planes. Rotator cuff strength normal throughout. No signs of impingement with negative Neer and Hawkin's tests, empty can. Speeds and Yergason's tests normal. No labral pathology noted with negative Obrien's, negative crank, negative clunk, and good stability. Normal scapular function observed. No painful arc and no drop arm sign. Positive crossarm sign No apprehension sign  Procedure: Real-time Ultrasound Guided injection of the left acromioclavicular joint Device: GE Logiq E  Verbal informed consent obtained.  Time-out conducted.  Noted no overlying erythema, induration, or other signs of  local infection.  Skin prepped in a sterile fashion.  Local anesthesia: Topical Ethyl chloride.  With sterile technique and under real time ultrasound guidance:  1/2 cc Kenalog 40, 1/2 cc lidocaine injected easily Completed without difficulty  Pain immediately resolved suggesting accurate placement of the medication.  Advised to call if fevers/chills, erythema, induration, drainage, or persistent bleeding.  Images permanently stored and available for review in the ultrasound unit.  Impression: Technically successful ultrasound guided injection.  Impression and Recommendations:    Chronic left shoulder pain Subacromial impingement symptoms have resolved. Pain now is referrable to the acromioclavicular joint. This was injected today. Hydrocodone for pain. Return to see me in 1 month, MRI if no better.  Interestingly shoulder MRI is unremarkable, we should probably take a look at her cervical spine as a possible cause of the pain now.  Ideally this would be in the form of an x-ray and an MRI if okay with her.   ___________________________________________ Gwen Her. Dianah Field, M.D., ABFM., CAQSM. Primary Care and Sports Medicine [facility]  Adjunct Professor of [place] [employer] [facility] [employer] [facility] [employer]

## 2018-11-22 ENCOUNTER — Encounter: Payer: Self-pay | Admitting: Sports Medicine

## 2018-11-22 DIAGNOSIS — M25512 Pain in left shoulder: Secondary | ICD-10-CM

## 2018-11-22 DIAGNOSIS — M7552 Bursitis of left shoulder: Secondary | ICD-10-CM

## 2018-11-28 ENCOUNTER — Ambulatory Visit (INDEPENDENT_AMBULATORY_CARE_PROVIDER_SITE_OTHER): Payer: BC Managed Care – PPO

## 2018-11-28 ENCOUNTER — Other Ambulatory Visit: Payer: Self-pay

## 2018-11-28 DIAGNOSIS — M7552 Bursitis of left shoulder: Secondary | ICD-10-CM

## 2018-11-28 DIAGNOSIS — M25512 Pain in left shoulder: Secondary | ICD-10-CM

## 2018-11-28 IMAGING — MR MRI OF THE LEFT SHOULDER WITHOUT CONTRAST
5 series · 40 of 40 positions shown · non-contrast
Comparison: None.

CLINICAL DATA: Left shoulder pain and painful range of motion since

EXAM:
MRI OF THE LEFT SHOULDER WITHOUT CONTRAST
TECHNIQUE: Multiplanar, multisequence MR imaging of the shoulder was performed.
No intravenous contrast was administered.

[Series 3: PD fat-sat · axial · 4.0mm · 0.59mm/px · z∈[-38,+49]mm · 8 of 21 slices shown (1 of 2)]
[im 1/21]
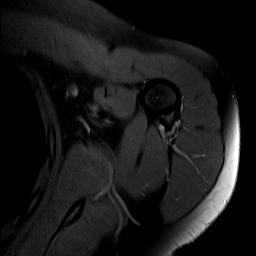
[im 3/21]
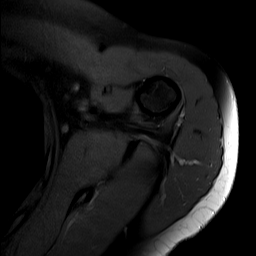
[im 6/21]
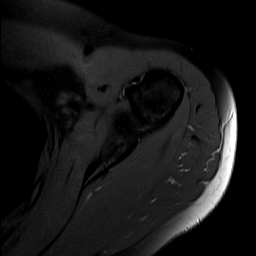
[im 9/21]
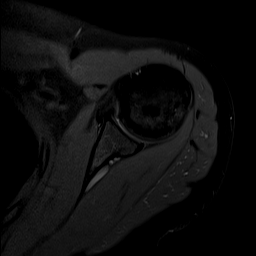
[im 12/21]
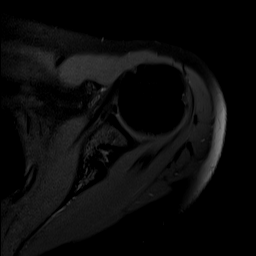
[im 15/21]
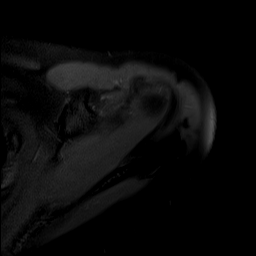
[im 18/21]
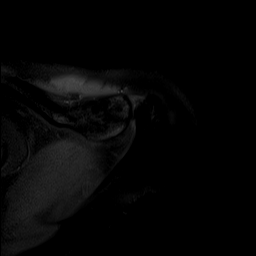
[im 21/21]
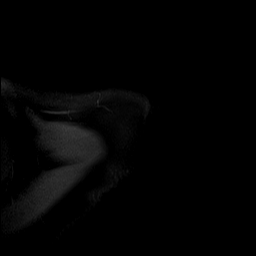

[Series 4: T2 fat-sat · oblique · 4.0mm · 0.59mm/px · 9 of 23 slices shown (1 of 2)]
[im 1/23]
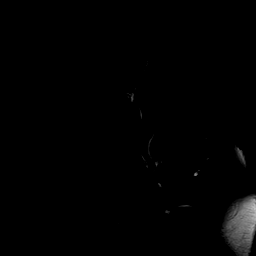
[im 3/23]
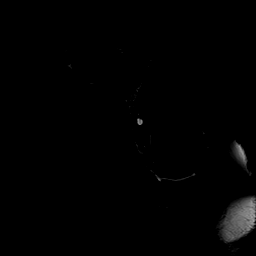
[im 6/23]
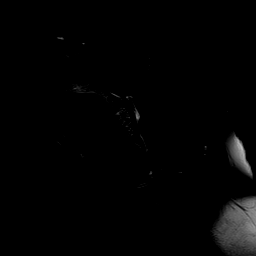
[im 9/23]
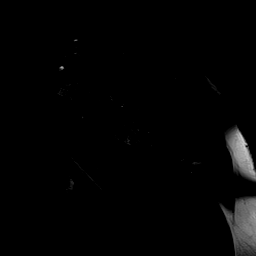
[im 12/23]
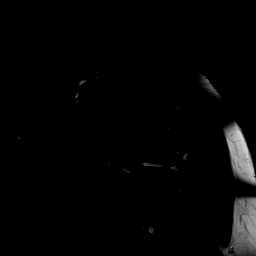
[im 14/23]
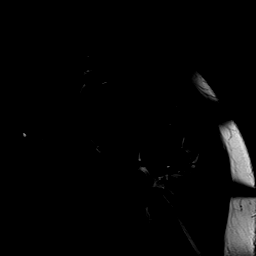
[im 17/23]
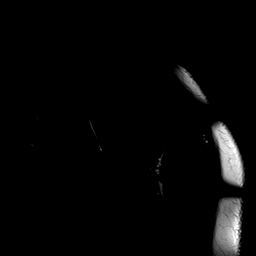
[im 20/23]
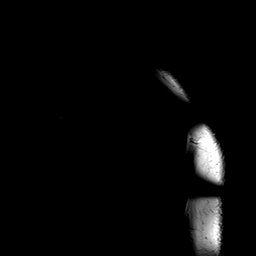
[im 23/23]
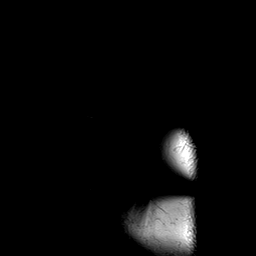

[Series 5: T1 · oblique · 4.0mm · 0.59mm/px · 9 of 23 slices shown]
[im 1/23]
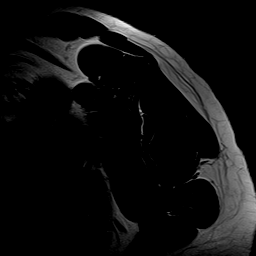
[im 3/23]
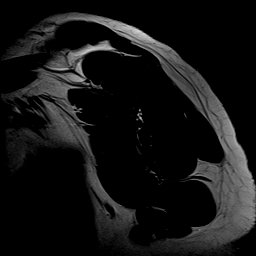
[im 6/23]
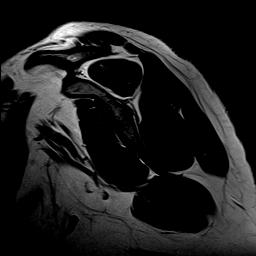
[im 9/23]
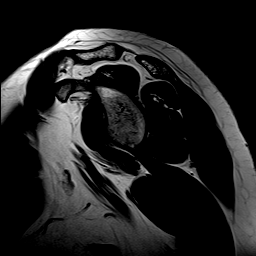
[im 12/23]
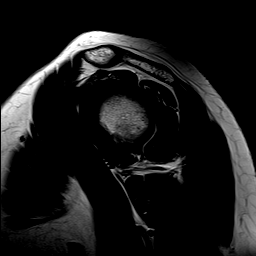
[im 14/23]
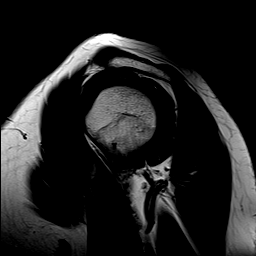
[im 17/23]
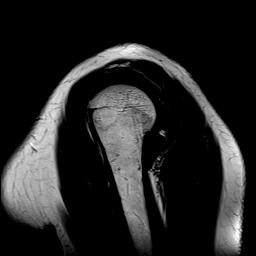
[im 20/23]
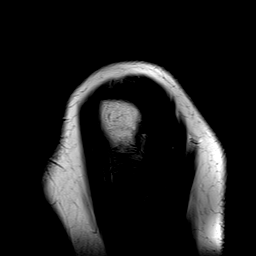
[im 23/23]
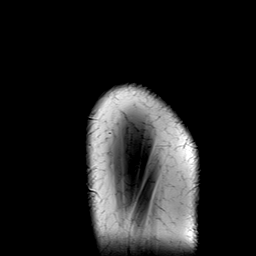

[Series 6: T2 fat-sat · oblique · 4.0mm · 0.59mm/px · 7 of 19 slices shown (2 of 2)]
[im 1/19]
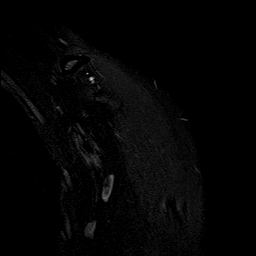
[im 4/19]
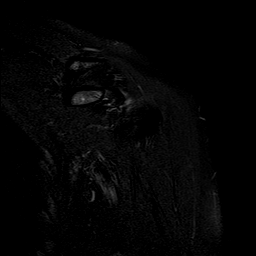
[im 7/19]
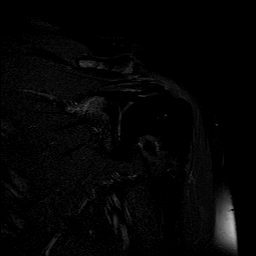
[im 10/19]
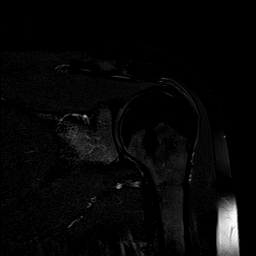
[im 13/19]
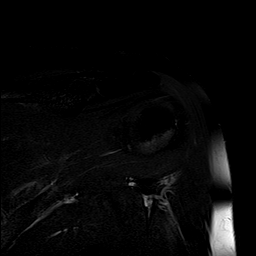
[im 16/19]
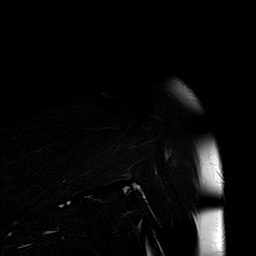
[im 19/19]
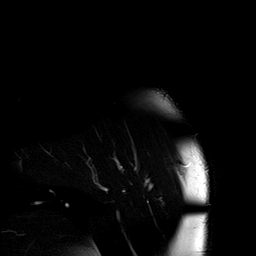

[Series 7: PD fat-sat · oblique · 4.0mm · 0.59mm/px · 7 of 19 slices shown (2 of 2)]
[im 1/19]
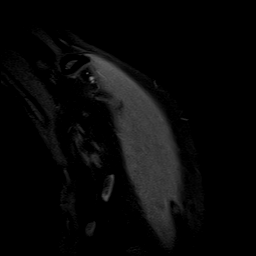
[im 4/19]
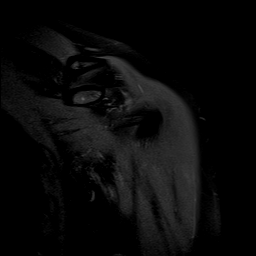
[im 7/19]
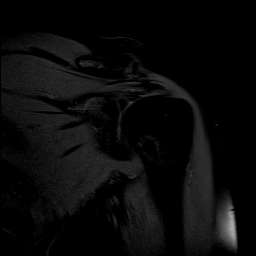
[im 10/19]
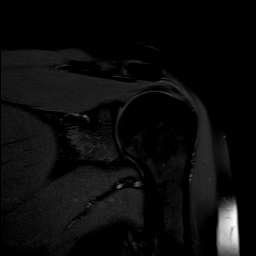
[im 13/19]
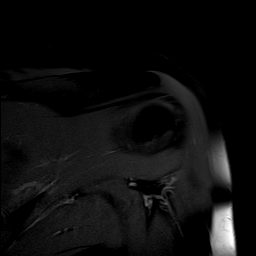
[im 16/19]
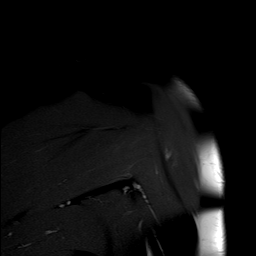
[im 19/19]
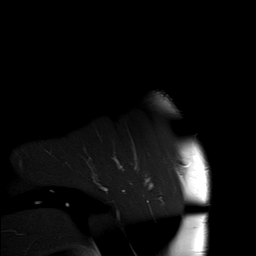

[40 of 40 positions shown; findings below may reference images not displayed]

FINDINGS: Rotator cuff: Intact supraspinatus, infraspinatus, teres minor and
subscapularis tendons.

Muscles: No atrophy or abnormal signal of the muscles of the rotator
cuff.

Biceps long head:  Properly located and intact.

Acromioclavicular Joint:  Normal.  Type 2 acromion. No bursitis.

Glenohumeral Joint: No joint effusion. No chondral defect.

Labrum:  Normal.

Bones:  Normal.

Other: None
IMPRESSION: Normal MRI of the left shoulder.

## 2018-11-30 ENCOUNTER — Encounter: Payer: Self-pay | Admitting: Sports Medicine

## 2018-11-30 DIAGNOSIS — G8929 Other chronic pain: Secondary | ICD-10-CM

## 2018-12-05 ENCOUNTER — Other Ambulatory Visit: Payer: Self-pay

## 2018-12-05 ENCOUNTER — Ambulatory Visit (INDEPENDENT_AMBULATORY_CARE_PROVIDER_SITE_OTHER): Payer: BC Managed Care – PPO

## 2018-12-05 DIAGNOSIS — G8929 Other chronic pain: Secondary | ICD-10-CM | POA: Diagnosis not present

## 2018-12-05 DIAGNOSIS — M5030 Other cervical disc degeneration, unspecified cervical region: Secondary | ICD-10-CM | POA: Diagnosis not present

## 2018-12-05 DIAGNOSIS — M25512 Pain in left shoulder: Secondary | ICD-10-CM | POA: Diagnosis not present

## 2018-12-05 IMAGING — MR MRI CERVICAL SPINE WITHOUT CONTRAST
5 series · 34 of 48 positions shown · non-contrast
Comparison: None.

CLINICAL DATA: Chronic left shoulder pain M25.512, [DE]
([DE]-CM). Left upper shoulder and trapezial radicular symptoms,
referred pain, interventional planning. Failed greater than 6 weeks
of physician directed conservative measures; Radiculopathy.

EXAM:
MRI CERVICAL SPINE WITHOUT CONTRAST
TECHNIQUE: Multiplanar, multisequence MR imaging of the cervical spine was
performed. No intravenous contrast was administered.

[Series 2: T2 · sagittal · 3.0mm · 0.56mm/px · 6 of 13 slices shown (1 of 2)]
[im 1/13]
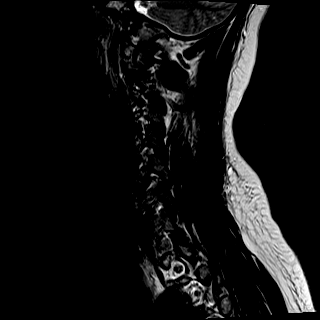
[im 3/13]
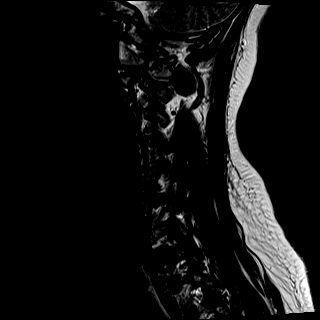
[im 5/13]
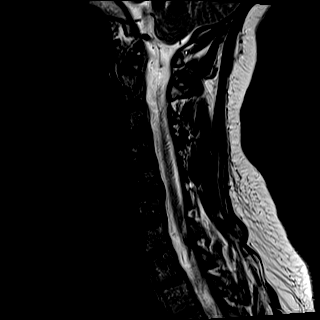
[im 8/13]
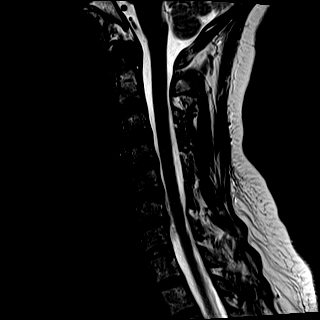
[im 10/13]
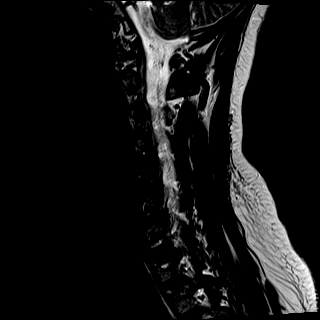
[im 13/13]
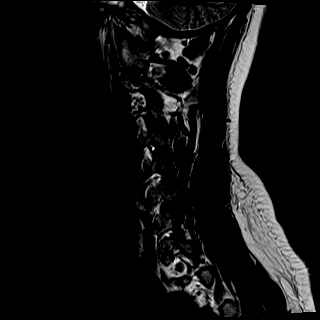

[Series 3: T1 · sagittal · 3.0mm · 0.70mm/px · 7 of 13 slices shown]
[im 1/13]
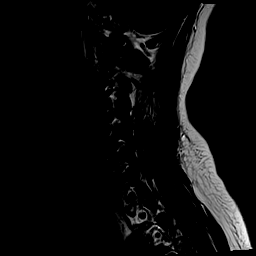
[im 3/13]
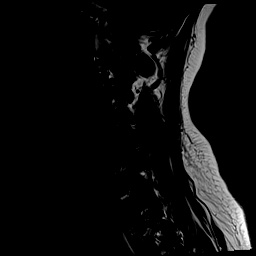
[im 5/13]
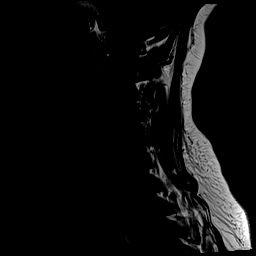
[im 7/13]
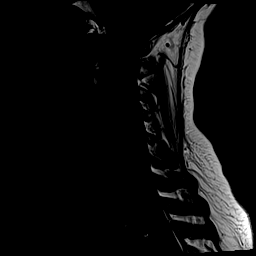
[im 9/13]
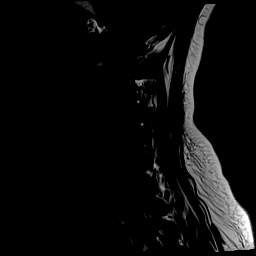
[im 11/13]
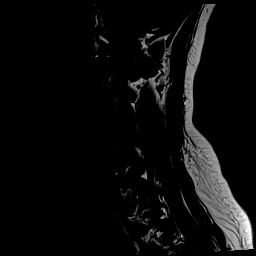
[im 13/13]
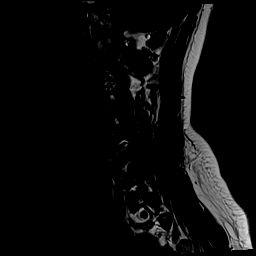

[Series 4: STIR · sagittal · 3.0mm · 0.35mm/px · 7 of 13 slices shown]
[im 1/13]
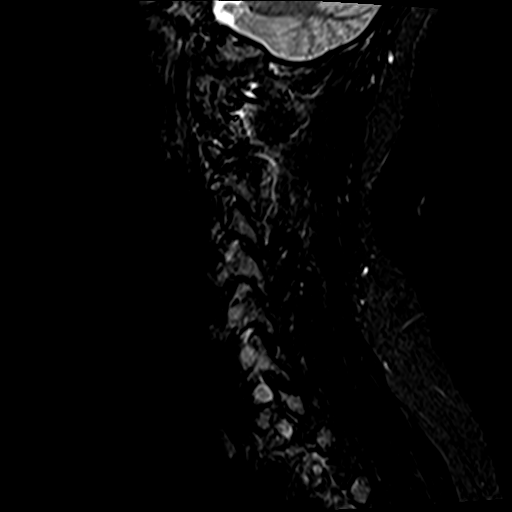
[im 3/13]
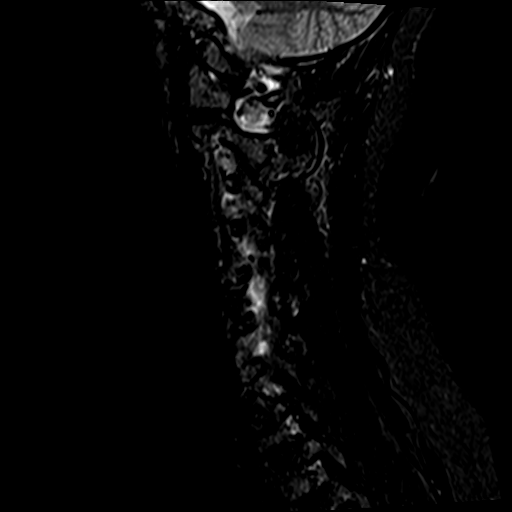
[im 5/13]
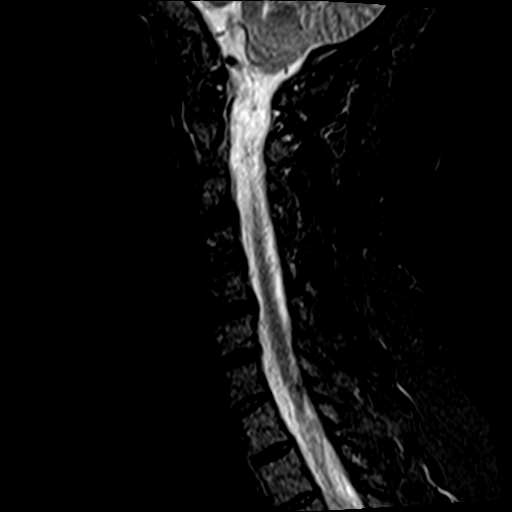
[im 7/13]
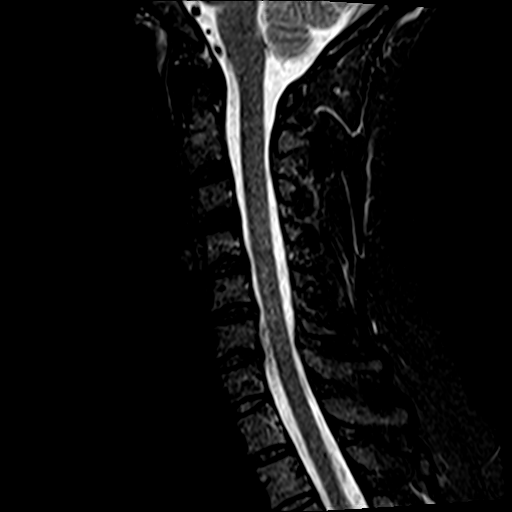
[im 9/13]
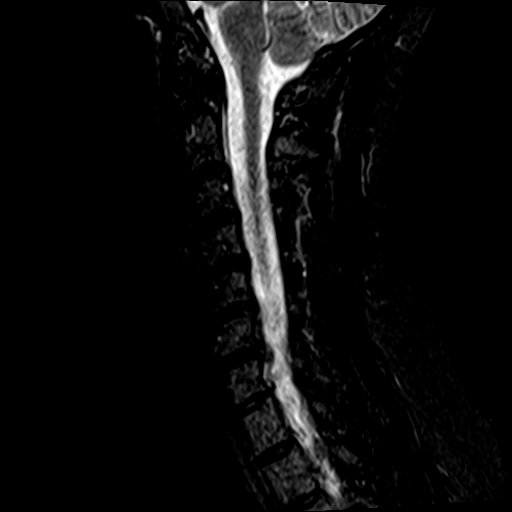
[im 11/13]
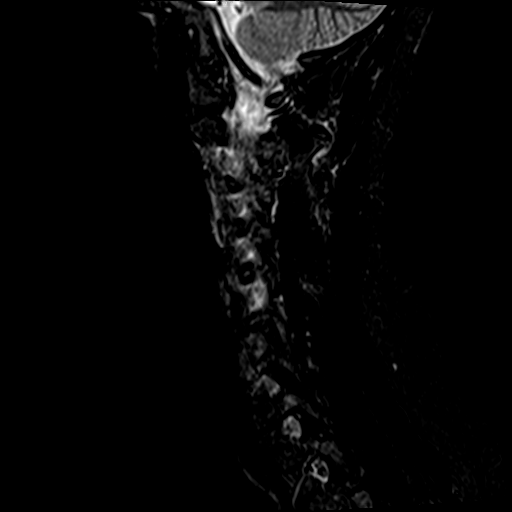
[im 13/13]
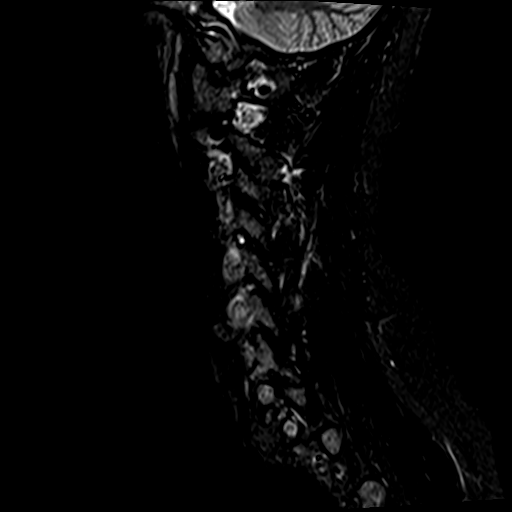

[Series 5: T2 · axial · 3.0mm · 0.62mm/px · z∈[-77,+20]mm · 8 of 27 slices shown (2 of 2)]
[im 1/27]
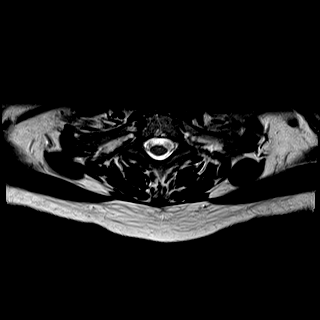
[im 5/27]
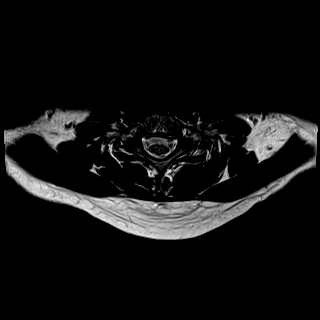
[im 9/27]
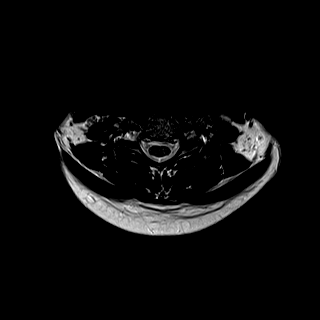
[im 13/27]
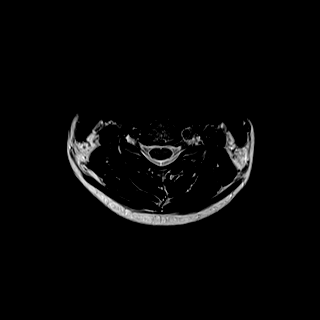
[im 15/27]
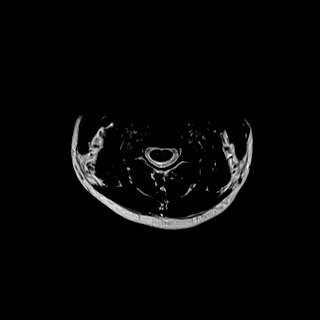
[im 19/27]
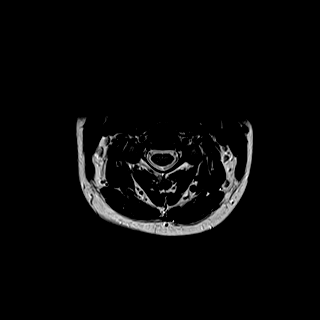
[im 23/27]
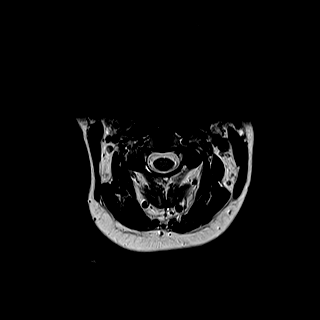
[im 27/27]
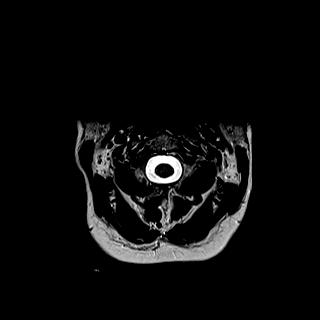

[Series 6: mpgr ax · axial · 3.0mm · 0.35mm/px · z∈[-68,-1]mm · 6 of 27 slices shown]
[im 1/27]
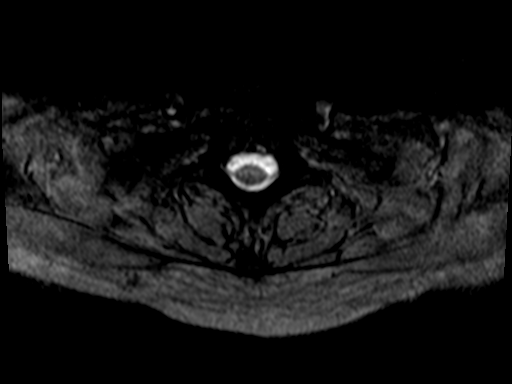
[im 5/27]
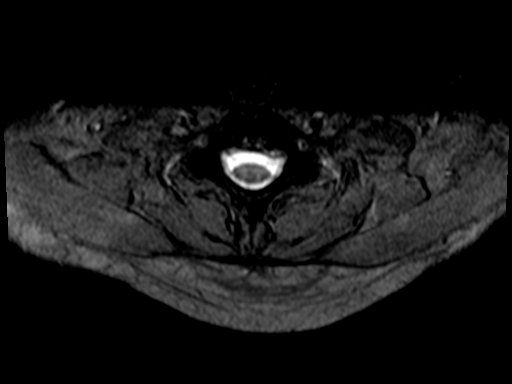
[im 9/27]
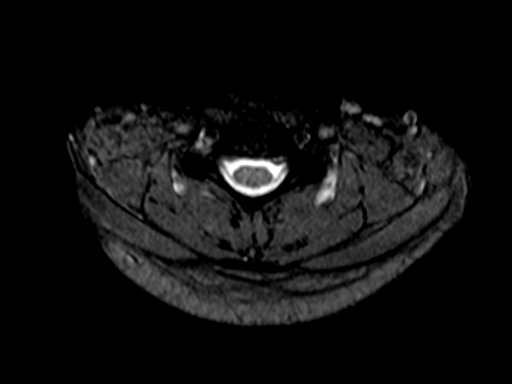
[im 13/27]
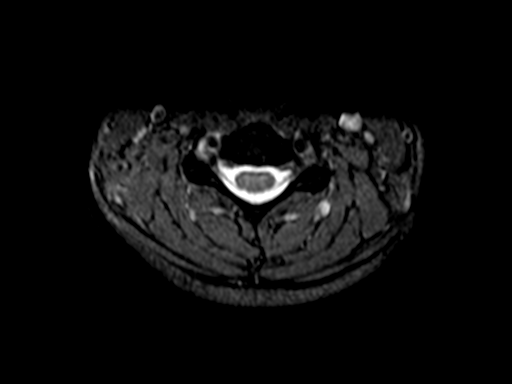
[im 15/27]
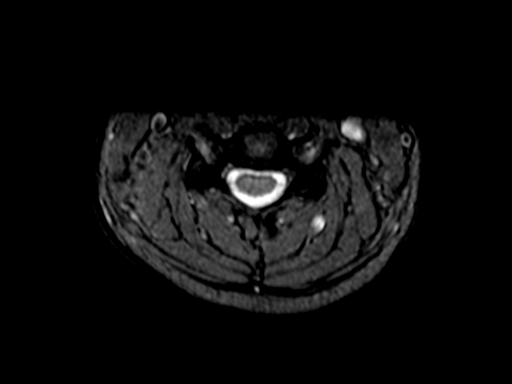
[im 19/27]
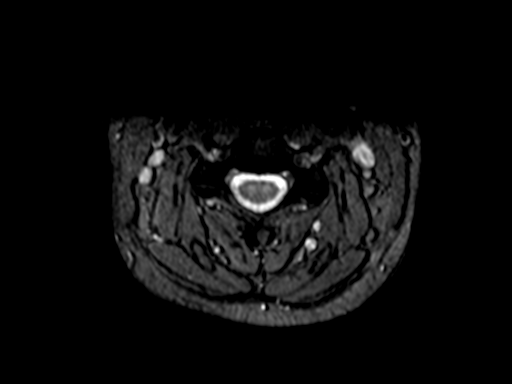

[34 of 48 positions shown; findings below may reference images not displayed]

FINDINGS: Alignment: Slight straightening of the normal cervical lordosis. No
subluxation.

Vertebrae: No fracture, evidence of discitis, or bone lesion.

Cord: Normal signal and morphology.

Posterior Fossa, vertebral arteries, paraspinal tissues: Negative.

Disc levels:

C2-3:  Normal.

C3-4:  Normal.

C4-5:  Normal.

C5-6:  Normal.

C6-7: Central and leftward protrusion/extrusion. LEFT C7 foraminal
narrowing. No cord flattening.

C7-T1:  Normal.
IMPRESSION: Central and leftward protrusion/extrusion. Correlate clinically for
LEFT C7 radiculopathy.

No other significant abnormalities in the cervical spine.

## 2018-12-06 ENCOUNTER — Ambulatory Visit: Payer: BC Managed Care – PPO | Admitting: Sports Medicine

## 2018-12-06 ENCOUNTER — Encounter: Payer: Self-pay | Admitting: Sports Medicine

## 2018-12-06 DIAGNOSIS — M5412 Radiculopathy, cervical region: Secondary | ICD-10-CM

## 2018-12-06 DIAGNOSIS — G8929 Other chronic pain: Secondary | ICD-10-CM

## 2018-12-06 DIAGNOSIS — M25512 Pain in left shoulder: Secondary | ICD-10-CM

## 2018-12-07 ENCOUNTER — Ambulatory Visit: Payer: BC Managed Care – PPO | Admitting: Sports Medicine

## 2018-12-28 ENCOUNTER — Encounter: Payer: Self-pay | Admitting: Sports Medicine

## 2018-12-29 DIAGNOSIS — M5030 Other cervical disc degeneration, unspecified cervical region: Secondary | ICD-10-CM | POA: Diagnosis not present

## 2018-12-29 DIAGNOSIS — M47812 Spondylosis without myelopathy or radiculopathy, cervical region: Secondary | ICD-10-CM | POA: Diagnosis not present

## 2018-12-29 DIAGNOSIS — M9981 Other biomechanical lesions of cervical region: Secondary | ICD-10-CM | POA: Diagnosis not present

## 2018-12-29 DIAGNOSIS — M4722 Other spondylosis with radiculopathy, cervical region: Secondary | ICD-10-CM | POA: Diagnosis not present

## 2019-01-02 DIAGNOSIS — M4722 Other spondylosis with radiculopathy, cervical region: Secondary | ICD-10-CM | POA: Insufficient documentation

## 2019-01-02 DIAGNOSIS — M5030 Other cervical disc degeneration, unspecified cervical region: Secondary | ICD-10-CM | POA: Insufficient documentation

## 2019-01-02 DIAGNOSIS — M47812 Spondylosis without myelopathy or radiculopathy, cervical region: Secondary | ICD-10-CM | POA: Insufficient documentation

## 2019-01-02 DIAGNOSIS — M4802 Spinal stenosis, cervical region: Secondary | ICD-10-CM | POA: Insufficient documentation

## 2019-01-02 DIAGNOSIS — M7918 Myalgia, other site: Secondary | ICD-10-CM | POA: Insufficient documentation

## 2019-01-02 DIAGNOSIS — G894 Chronic pain syndrome: Secondary | ICD-10-CM | POA: Insufficient documentation

## 2019-01-05 DIAGNOSIS — M9981 Other biomechanical lesions of cervical region: Secondary | ICD-10-CM | POA: Diagnosis not present

## 2019-01-05 DIAGNOSIS — M4722 Other spondylosis with radiculopathy, cervical region: Secondary | ICD-10-CM | POA: Diagnosis not present

## 2019-01-05 DIAGNOSIS — M5030 Other cervical disc degeneration, unspecified cervical region: Secondary | ICD-10-CM | POA: Diagnosis not present

## 2019-01-18 ENCOUNTER — Encounter: Payer: Self-pay | Admitting: Family Medicine

## 2019-01-26 DIAGNOSIS — M5030 Other cervical disc degeneration, unspecified cervical region: Secondary | ICD-10-CM | POA: Diagnosis not present

## 2019-01-26 DIAGNOSIS — M9981 Other biomechanical lesions of cervical region: Secondary | ICD-10-CM | POA: Diagnosis not present

## 2019-01-26 DIAGNOSIS — M4722 Other spondylosis with radiculopathy, cervical region: Secondary | ICD-10-CM | POA: Diagnosis not present

## 2019-01-28 DIAGNOSIS — M5030 Other cervical disc degeneration, unspecified cervical region: Secondary | ICD-10-CM | POA: Diagnosis not present

## 2019-01-28 DIAGNOSIS — M4722 Other spondylosis with radiculopathy, cervical region: Secondary | ICD-10-CM | POA: Diagnosis not present

## 2019-02-01 DIAGNOSIS — M5030 Other cervical disc degeneration, unspecified cervical region: Secondary | ICD-10-CM | POA: Diagnosis not present

## 2019-02-01 DIAGNOSIS — M4722 Other spondylosis with radiculopathy, cervical region: Secondary | ICD-10-CM | POA: Diagnosis not present

## 2019-02-01 DIAGNOSIS — M9981 Other biomechanical lesions of cervical region: Secondary | ICD-10-CM | POA: Diagnosis not present

## 2019-02-03 DIAGNOSIS — M5412 Radiculopathy, cervical region: Secondary | ICD-10-CM | POA: Diagnosis not present

## 2019-02-03 DIAGNOSIS — R6889 Other general symptoms and signs: Secondary | ICD-10-CM | POA: Diagnosis not present

## 2019-02-03 DIAGNOSIS — R293 Abnormal posture: Secondary | ICD-10-CM | POA: Diagnosis not present

## 2019-02-07 DIAGNOSIS — M5412 Radiculopathy, cervical region: Secondary | ICD-10-CM | POA: Diagnosis not present

## 2019-02-07 DIAGNOSIS — R6889 Other general symptoms and signs: Secondary | ICD-10-CM | POA: Diagnosis not present

## 2019-02-07 DIAGNOSIS — R293 Abnormal posture: Secondary | ICD-10-CM | POA: Diagnosis not present

## 2019-02-09 DIAGNOSIS — R6889 Other general symptoms and signs: Secondary | ICD-10-CM | POA: Diagnosis not present

## 2019-02-09 DIAGNOSIS — R293 Abnormal posture: Secondary | ICD-10-CM | POA: Diagnosis not present

## 2019-02-09 DIAGNOSIS — M5412 Radiculopathy, cervical region: Secondary | ICD-10-CM | POA: Diagnosis not present

## 2019-03-10 ENCOUNTER — Other Ambulatory Visit: Payer: Self-pay | Admitting: Family Medicine

## 2019-03-30 DIAGNOSIS — M9971 Connective tissue and disc stenosis of intervertebral foramina of cervical region: Secondary | ICD-10-CM | POA: Diagnosis not present

## 2019-03-30 DIAGNOSIS — M4722 Other spondylosis with radiculopathy, cervical region: Secondary | ICD-10-CM | POA: Diagnosis not present

## 2019-03-30 DIAGNOSIS — M5030 Other cervical disc degeneration, unspecified cervical region: Secondary | ICD-10-CM | POA: Diagnosis not present

## 2019-04-04 ENCOUNTER — Encounter: Payer: Self-pay | Admitting: Family Medicine

## 2019-04-04 DIAGNOSIS — N924 Excessive bleeding in the premenopausal period: Secondary | ICD-10-CM

## 2019-04-05 MED ORDER — MEDROXYPROGESTERONE ACETATE 10 MG PO TABS: 10.0000 mg | ORAL_TABLET | Freq: Every day | ORAL | 0 refills | Status: DC

## 2019-04-11 MED ORDER — MEDROXYPROGESTERONE ACETATE 10 MG PO TABS: 20.0000 mg | ORAL_TABLET | Freq: Two times a day (BID) | ORAL | 0 refills | Status: DC

## 2019-04-11 NOTE — Addendum Note (Signed)
Addended by: Beatrice Lecher D on: 04/11/2019 09:05 AM   Modules accepted: Orders

## 2019-04-18 ENCOUNTER — Encounter: Payer: Self-pay | Admitting: Family Medicine

## 2019-04-18 ENCOUNTER — Ambulatory Visit (INDEPENDENT_AMBULATORY_CARE_PROVIDER_SITE_OTHER): Payer: BC Managed Care – PPO

## 2019-04-18 ENCOUNTER — Other Ambulatory Visit: Payer: Self-pay

## 2019-04-18 DIAGNOSIS — N939 Abnormal uterine and vaginal bleeding, unspecified: Secondary | ICD-10-CM

## 2019-04-18 DIAGNOSIS — N924 Excessive bleeding in the premenopausal period: Secondary | ICD-10-CM

## 2019-04-18 IMAGING — US US PELVIS COMPLETE WITH TRANSVAGINAL
1 series · 13 of 25 positions shown · non-contrast
Comparison: [DATE]

CLINICAL DATA: Excessive heavy bleeding and premenopausal period

EXAM:
TRANSABDOMINAL AND TRANSVAGINAL ULTRASOUND OF PELVIS
TECHNIQUE: Both transabdominal and transvaginal ultrasound examinations of the
pelvis were performed. Transabdominal technique was performed for
global imaging of the pelvis including uterus, ovaries, adnexal
regions, and pelvic cul-de-sac. It was necessary to proceed with
endovaginal exam following the transabdominal exam to visualize the
endometrium and ovaries.

[Series 1: us pelvis complete with transvaginal · 0.18mm/px · 37 acquisitions, 13 frames shown]
[im 1/37]
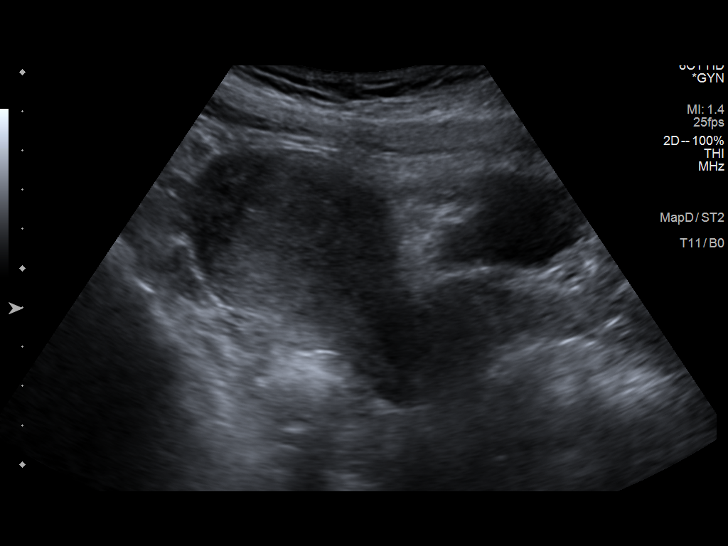
[im 4/37]
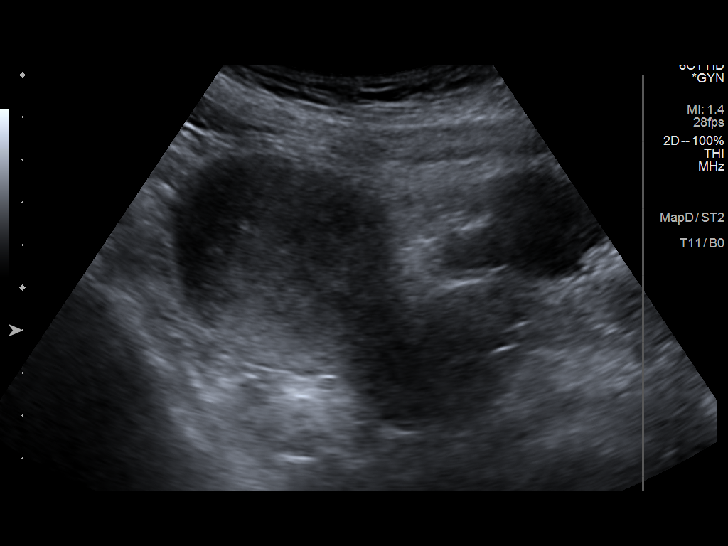
[im 7/37]
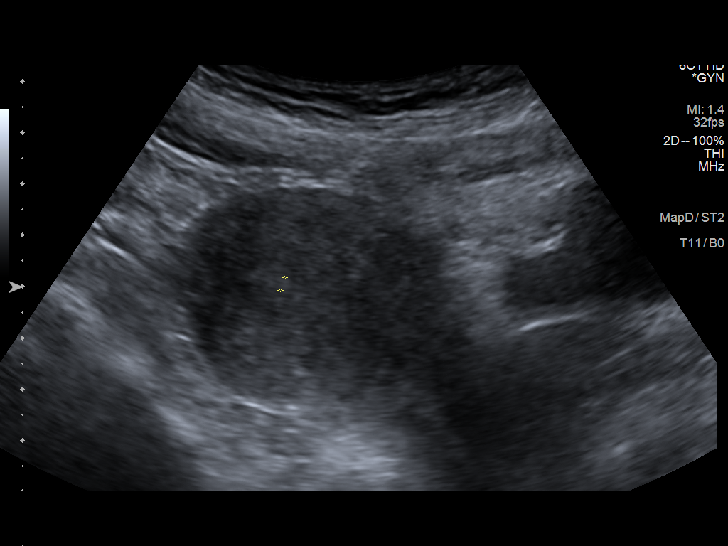
[im 10/37]
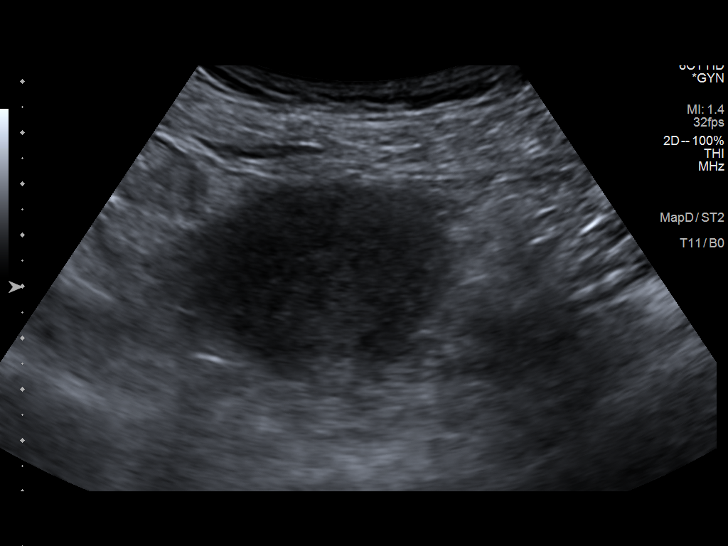
[im 13/37]
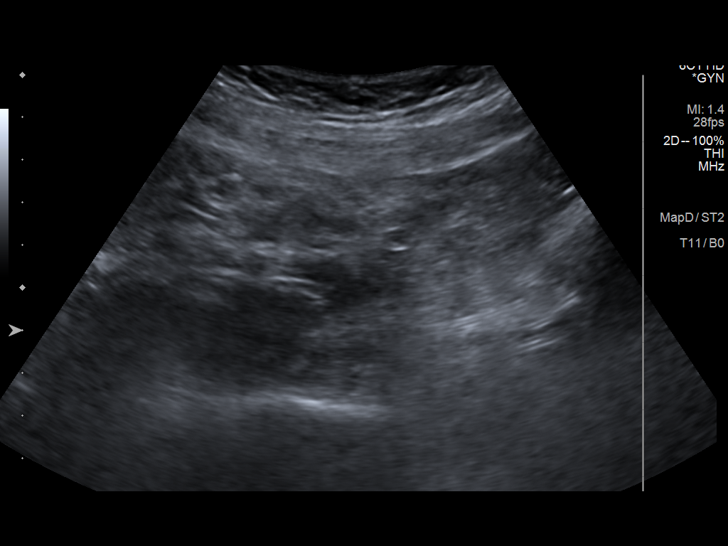
[im 16/37]
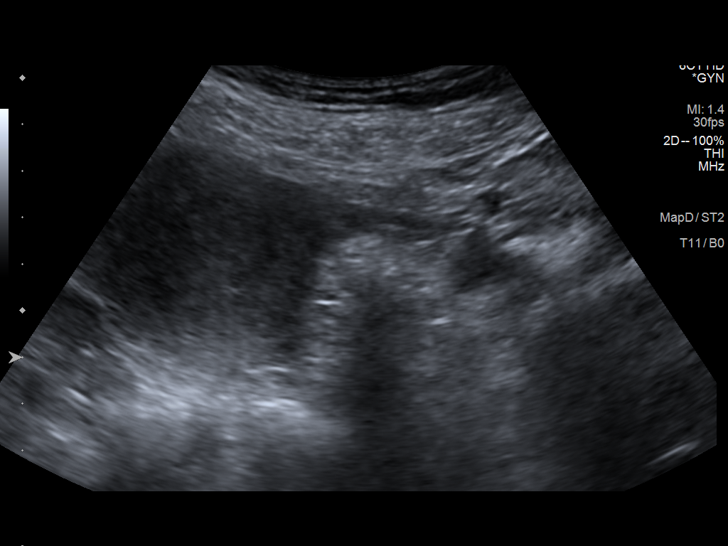
[im 19/37]
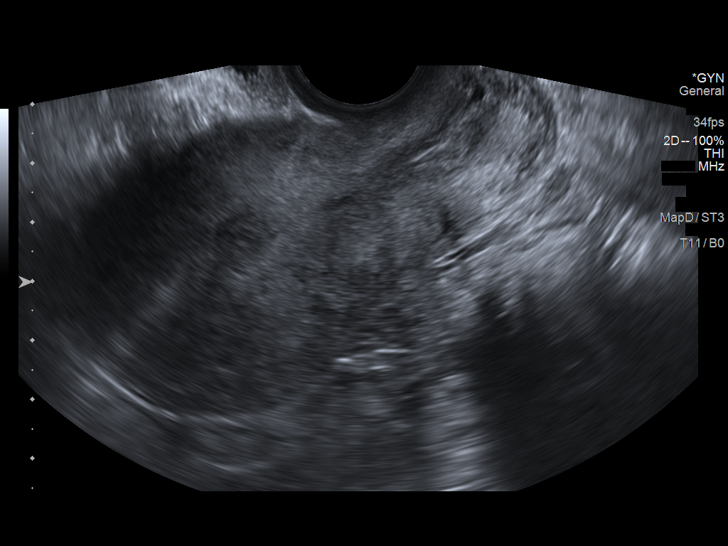
[im 22/37]
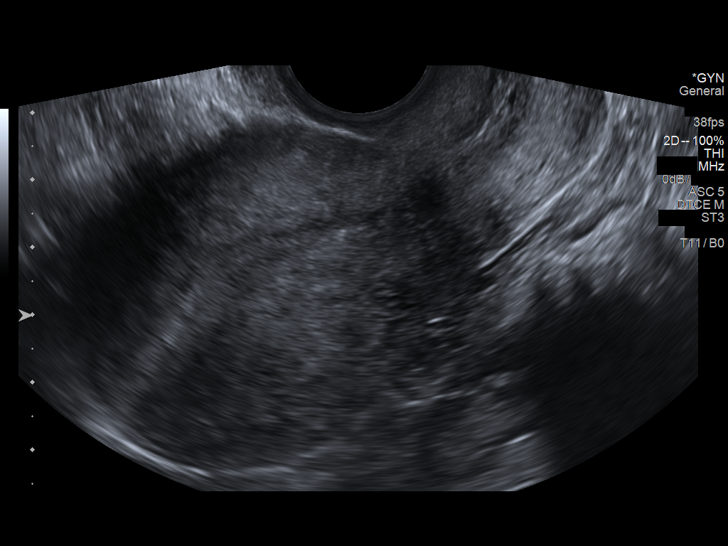
[im 25/37]
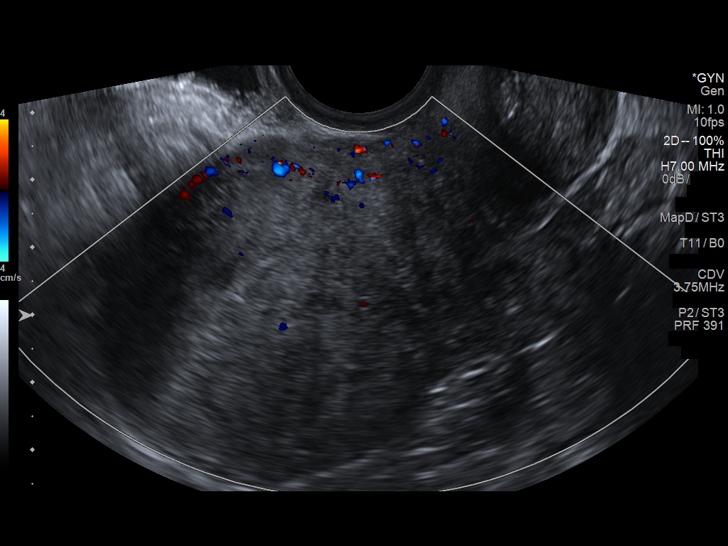
[im 28/37]
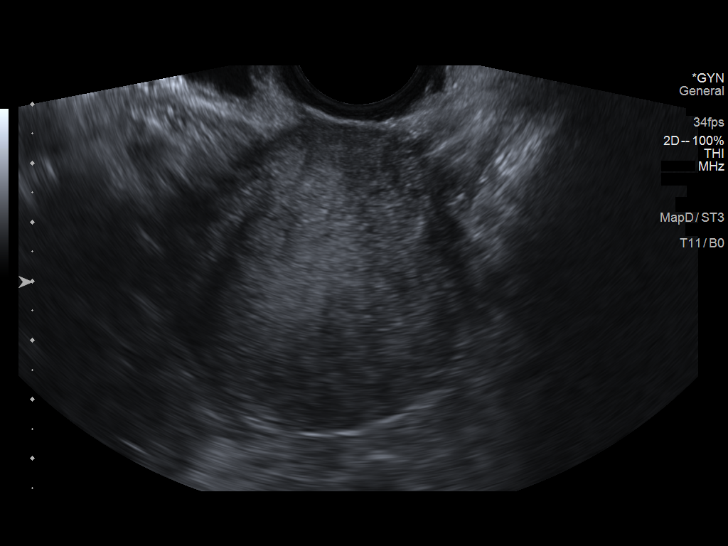
[im 31/37]
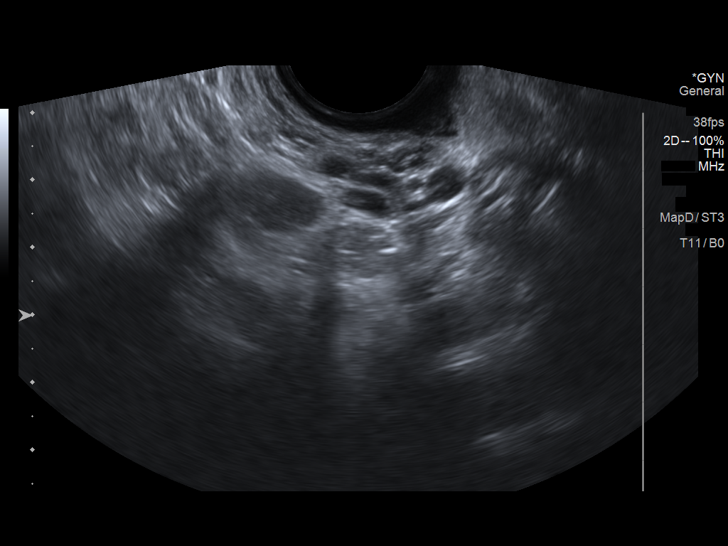
[im 34/37]
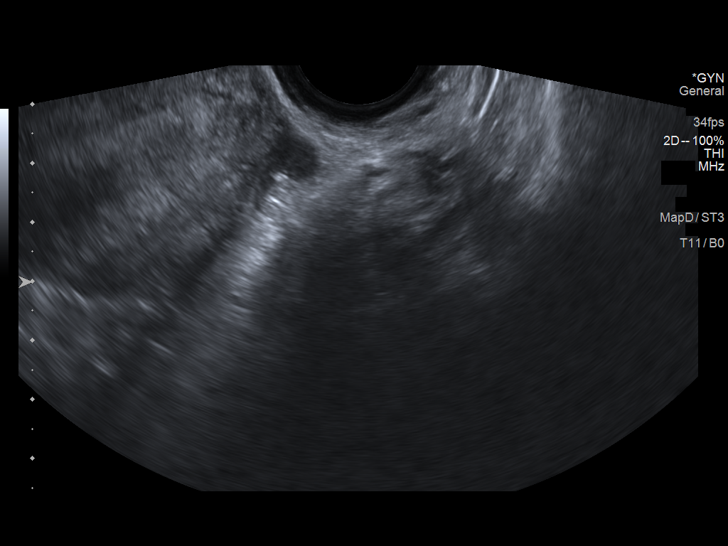
[im 37/37]
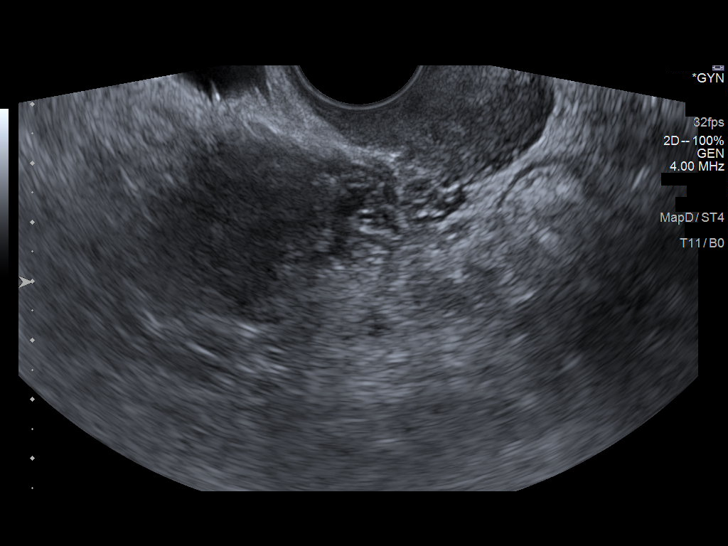

[13 of 25 positions shown; findings below may reference images not displayed]

FINDINGS: Uterus

Measurements: 8.8 x 4.8 x 5.3 cm = volume: 116 mL. Anteverted.
Heterogeneous myometrium with poor definition of the endometrial
complex and asymmetric thickening of the posterior wall versus
anterior. No focal mass.

Endometrium

Thickness: 4 mm.  No endometrial fluid or focal abnormality.

Right ovary

Not visualized on either transabdominal or endovaginal imaging,
likely obscured by bowel

Left ovary

Not visualized on either transabdominal or endovaginal imaging,
likely obscured by bowel

Other findings

No free pelvic fluid or adnexal masses.
IMPRESSION: Nonvisualization of ovaries.

Unremarkable endometrial complex.

Heterogeneous myometrium with asymmetric thickening of the posterior
wall and poor definition of the endometrial complex margin, cannot
exclude adenomyosis.

No focal uterine mass identified.

## 2019-04-19 NOTE — Telephone Encounter (Signed)
Please place gyn refer.  Let pt know not urgent.

## 2019-05-02 ENCOUNTER — Encounter: Payer: Self-pay | Admitting: Family Medicine

## 2019-05-02 ENCOUNTER — Other Ambulatory Visit: Payer: Self-pay

## 2019-05-02 ENCOUNTER — Encounter: Payer: BC Managed Care – PPO | Admitting: Family Medicine

## 2019-05-02 ENCOUNTER — Ambulatory Visit (INDEPENDENT_AMBULATORY_CARE_PROVIDER_SITE_OTHER): Payer: BC Managed Care – PPO | Admitting: Family Medicine

## 2019-05-02 VITALS — BP 136/84 | HR 77 | Ht 63.0 in | Wt 130.0 lb

## 2019-05-02 DIAGNOSIS — Z0000 Encounter for general adult medical examination without abnormal findings: Secondary | ICD-10-CM | POA: Diagnosis not present

## 2019-05-02 NOTE — Progress Notes (Signed)
Subjective:     Tina Pearson is a 38 y.o. female and is here for a comprehensive physical exam. The patient reports no problems.  She does have a migraine headache today and so blood pressure is up a little bit.  She declines having a Pap smear today as she has an appoint with OB/GYN in about 2 weeks for her heavy bleeding.  Social History   Socioeconomic History  . Marital status: Married    Spouse name: Not on file  . Number of children: 1  . Years of education: Not on file  . Highest education level: Not on file  Occupational History  . Occupation: Pharmacologist    Tobacco Use  . Smoking status: Never Smoker  . Smokeless tobacco: Never Used  Substance and Sexual Activity  . Alcohol use: Not on file    Comment: rare  . Drug use: Not on file  . Sexual activity: Not on file    Comment: Copy, runs 2 X week, eats healthy, married, no kids.  Other Topics Concern  . Not on file  Social History Narrative   Some exercise. Drinks caffeine daily.    Social Determinants of Health   Financial Resource Strain:   . Difficulty of Paying Living Expenses: Not on file  Food Insecurity:   . Worried About Charity fundraiser in the Last Year: Not on file  . Ran Out of Food in the Last Year: Not on file  Transportation Needs:   . Lack of Transportation (Medical): Not on file  . Lack of Transportation (Non-Medical): Not on file  Physical Activity:   . Days of Exercise per Week: Not on file  . Minutes of Exercise per Session: Not on file  Stress:   . Feeling of Stress : Not on file  Social Connections:   . Frequency of Communication with Friends and Family: Not on file  . Frequency of Social Gatherings with Friends and Family: Not on file  . Attends Religious Services: Not on file  . Active Member of Clubs or Organizations: Not on file  . Attends Archivist Meetings: Not on file  . Marital Status: Not on file  Intimate Partner Violence:   . Fear of Current or  Ex-Partner: Not on file  . Emotionally Abused: Not on file  . Physically Abused: Not on file  . Sexually Abused: Not on file   Health Maintenance  Topic Date Due  . PAP SMEAR-Modifier  09/15/2021  . TETANUS/TDAP  01/10/2027  . INFLUENZA VACCINE  Completed  . HIV Screening  Completed    The following portions of the patient's history were reviewed and updated as appropriate: allergies, current medications, past family history, past medical history, past social history, past surgical history and problem list.  Review of Systems A comprehensive review of systems was negative.   Objective:    BP 136/84   Pulse 77   Ht 5\' 3"  (1.6 m)   Wt 130 lb (59 kg)   LMP 03/24/2019 (Approximate)   SpO2 100%   BMI 23.03 kg/m  General appearance: alert, cooperative and appears stated age Head: Normocephalic, without obvious abnormality, atraumatic Eyes: conj clear, EOMI, PEERLA Ears: normal TM's and external ear canals both ears Nose: Nares normal. Septum midline. Mucosa normal. No drainage or sinus tenderness. Throat: lips, mucosa, and tongue normal; teeth and gums normal Neck: no adenopathy, no carotid bruit, no JVD, supple, symmetrical, trachea midline and thyromegaly, enlarged more on the right with palpable  nodule Back: symmetric, no curvature. ROM normal. No CVA tenderness. Lungs: clear to auscultation bilaterally Breasts: normal appearance, no masses or tenderness Heart: regular rate and rhythm, S1, S2 normal, no murmur, click, rub or gallop Abdomen: soft, non-tender; bowel sounds normal; no masses,  no organomegaly Extremities: extremities normal, atraumatic, no cyanosis or edema Pulses: 2+ and symmetric Skin: Skin color, texture, turgor normal. No rashes or lesions Lymph nodes: Cervical, supraclavicular, and axillary nodes normal. Neurologic: Alert and oriented X 3, normal strength and tone. Normal symmetric reflexes. Normal coordination and gait    Assessment:    Healthy female  exam.      Plan:     See After Visit Summary for Counseling Recommendations   Keep up a regular exercise program and make sure you are eating a healthy diet Try to eat 4 servings of dairy a day, or if you are lactose intolerant take a calcium with vitamin D daily.  Your vaccines are up to date.

## 2019-05-02 NOTE — Patient Instructions (Signed)
Health Maintenance, Female Adopting a healthy lifestyle and getting preventive care are important in promoting health and wellness. Ask your health care provider about:  The right schedule for you to have regular tests and exams.  Things you can do on your own to prevent diseases and keep yourself healthy. What should I know about diet, weight, and exercise? Eat a healthy diet   Eat a diet that includes plenty of vegetables, fruits, low-fat dairy products, and lean protein.  Do not eat a lot of foods that are high in solid fats, added sugars, or sodium. Maintain a healthy weight Body mass index (BMI) is used to identify weight problems. It estimates body fat based on height and weight. Your health care provider can help determine your BMI and help you achieve or maintain a healthy weight. Get regular exercise Get regular exercise. This is one of the most important things you can do for your health. Most adults should:  Exercise for at least 150 minutes each week. The exercise should increase your heart rate and make you sweat (moderate-intensity exercise).  Do strengthening exercises at least twice a week. This is in addition to the moderate-intensity exercise.  Spend less time sitting. Even light physical activity can be beneficial. Watch cholesterol and blood lipids Have your blood tested for lipids and cholesterol at 38 years of age, then have this test every 5 years. Have your cholesterol levels checked more often if:  Your lipid or cholesterol levels are high.  You are older than 38 years of age.  You are at high risk for heart disease. What should I know about cancer screening? Depending on your health history and family history, you may need to have cancer screening at various ages. This may include screening for:  Breast cancer.  Cervical cancer.  Colorectal cancer.  Skin cancer.  Lung cancer. What should I know about heart disease, diabetes, and high blood  pressure? Blood pressure and heart disease  High blood pressure causes heart disease and increases the risk of stroke. This is more likely to develop in people who have high blood pressure readings, are of African descent, or are overweight.  Have your blood pressure checked: ? Every 3-5 years if you are 18-39 years of age. ? Every year if you are 40 years old or older. Diabetes Have regular diabetes screenings. This checks your fasting blood sugar level. Have the screening done:  Once every three years after age 40 if you are at a normal weight and have a low risk for diabetes.  More often and at a younger age if you are overweight or have a high risk for diabetes. What should I know about preventing infection? Hepatitis B If you have a higher risk for hepatitis B, you should be screened for this virus. Talk with your health care provider to find out if you are at risk for hepatitis B infection. Hepatitis C Testing is recommended for:  Everyone born from 1945 through 1965.  Anyone with known risk factors for hepatitis C. Sexually transmitted infections (STIs)  Get screened for STIs, including gonorrhea and chlamydia, if: ? You are sexually active and are younger than 38 years of age. ? You are older than 38 years of age and your health care provider tells you that you are at risk for this type of infection. ? Your sexual activity has changed since you were last screened, and you are at increased risk for chlamydia or gonorrhea. Ask your health care provider if   you are at risk.  Ask your health care provider about whether you are at high risk for HIV. Your health care provider may recommend a prescription medicine to help prevent HIV infection. If you choose to take medicine to prevent HIV, you should first get tested for HIV. You should then be tested every 3 months for as long as you are taking the medicine. Pregnancy  If you are about to stop having your period (premenopausal) and  you may become pregnant, seek counseling before you get pregnant.  Take 400 to 800 micrograms (mcg) of folic acid every day if you become pregnant.  Ask for birth control (contraception) if you want to prevent pregnancy. Osteoporosis and menopause Osteoporosis is a disease in which the bones lose minerals and strength with aging. This can result in bone fractures. If you are 65 years old or older, or if you are at risk for osteoporosis and fractures, ask your health care provider if you should:  Be screened for bone loss.  Take a calcium or vitamin D supplement to lower your risk of fractures.  Be given hormone replacement therapy (HRT) to treat symptoms of menopause. Follow these instructions at home: Lifestyle  Do not use any products that contain nicotine or tobacco, such as cigarettes, e-cigarettes, and chewing tobacco. If you need help quitting, ask your health care provider.  Do not use street drugs.  Do not share needles.  Ask your health care provider for help if you need support or information about quitting drugs. Alcohol use  Do not drink alcohol if: ? Your health care provider tells you not to drink. ? You are pregnant, may be pregnant, or are planning to become pregnant.  If you drink alcohol: ? Limit how much you use to 0-1 drink a day. ? Limit intake if you are breastfeeding.  Be aware of how much alcohol is in your drink. In the [place] one drink equals one 12 oz bottle of beer (355 mL), one 5 oz glass of wine (148 mL), or one 1 oz glass of hard liquor (44 mL). General instructions  Schedule regular health, dental, and eye exams.  Stay current with your vaccines.  Tell your health care provider if: ? You often feel depressed. ? You have ever been abused or do not feel safe at home. Summary  Adopting a healthy lifestyle and getting preventive care are important in promoting health and wellness.  Follow your health care provider's instructions about healthy  diet, exercising, and getting tested or screened for diseases.  Follow your health care provider's instructions on monitoring your cholesterol and blood pressure. This information is not intended to replace advice given to you by your health care provider. Make sure you discuss any questions you have with your health care provider. Document Released: 11/04/2010 Document Revised: 04/14/2018 Document Reviewed: 04/14/2018 Elsevier Patient Education  2020 [employer] Inc.  

## 2019-05-03 ENCOUNTER — Encounter: Payer: Self-pay | Admitting: Family Medicine

## 2019-05-03 LAB — CBC
HCT: 43.2 % (ref 35.0–45.0)
Hemoglobin: 14.5 g/dL (ref 11.7–15.5)
MCH: 30.2 pg (ref 27.0–33.0)
MCHC: 33.6 g/dL (ref 32.0–36.0)
MCV: 90 fL (ref 80.0–100.0)
MPV: 12.6 fL — ABNORMAL HIGH (ref 7.5–12.5)
Platelets: 252 10*3/uL (ref 140–400)
RBC: 4.8 10*6/uL (ref 3.80–5.10)
RDW: 11.7 % (ref 11.0–15.0)
WBC: 6.3 10*3/uL (ref 3.8–10.8)

## 2019-05-03 LAB — LIPID PANEL W/REFLEX DIRECT LDL
Cholesterol: 269 mg/dL — ABNORMAL HIGH (ref <200)
HDL: 39 mg/dL — ABNORMAL LOW (ref >=50)
LDL Cholesterol (Calc): 191 mg/dL (calc) — ABNORMAL HIGH
Non-HDL Cholesterol (Calc): 230 mg/dL (calc) — ABNORMAL HIGH (ref <130)
Total CHOL/HDL Ratio: 6.9 (calc) — ABNORMAL HIGH (ref <5.0)
Triglycerides: 201 mg/dL — ABNORMAL HIGH (ref <150)

## 2019-05-03 LAB — COMPLETE METABOLIC PANEL WITH GFR
AG Ratio: 1.6 (calc) (ref 1.0–2.5)
ALT: 18 U/L (ref 6–29)
AST: 14 U/L (ref 10–30)
Albumin: 4.4 g/dL (ref 3.6–5.1)
Alkaline phosphatase (APISO): 72 U/L (ref 31–125)
BUN: 8 mg/dL (ref 7–25)
CO2: 24 mmol/L (ref 20–32)
Calcium: 9.4 mg/dL (ref 8.6–10.2)
Chloride: 105 mmol/L (ref 98–110)
Creat: 0.89 mg/dL (ref 0.50–1.10)
GFR, Est African American: 95 mL/min/{1.73_m2} (ref >=60)
GFR, Est Non African American: 82 mL/min/{1.73_m2} (ref >=60)
Globulin: 2.8 g/dL (calc) (ref 1.9–3.7)
Glucose, Bld: 84 mg/dL (ref 65–99)
Potassium: 4.4 mmol/L (ref 3.5–5.3)
Sodium: 139 mmol/L (ref 135–146)
Total Bilirubin: 0.5 mg/dL (ref 0.2–1.2)
Total Protein: 7.2 g/dL (ref 6.1–8.1)

## 2019-05-03 LAB — TSH: TSH: 3.16 mIU/L

## 2019-05-03 MED ORDER — ATORVASTATIN CALCIUM 20 MG PO TABS: 20.0000 mg | ORAL_TABLET | Freq: Every day | ORAL | 1 refills | Status: DC

## 2019-05-08 DIAGNOSIS — R0981 Nasal congestion: Secondary | ICD-10-CM | POA: Diagnosis not present

## 2019-05-08 DIAGNOSIS — R05 Cough: Secondary | ICD-10-CM | POA: Diagnosis not present

## 2019-05-08 DIAGNOSIS — R52 Pain, unspecified: Secondary | ICD-10-CM | POA: Diagnosis not present

## 2019-05-08 DIAGNOSIS — Z20822 Contact with and (suspected) exposure to covid-19: Secondary | ICD-10-CM | POA: Diagnosis not present

## 2019-05-11 ENCOUNTER — Other Ambulatory Visit: Payer: Self-pay

## 2019-05-11 ENCOUNTER — Ambulatory Visit (INDEPENDENT_AMBULATORY_CARE_PROVIDER_SITE_OTHER): Payer: BC Managed Care – PPO | Admitting: Nurse Practitioner

## 2019-05-11 ENCOUNTER — Encounter: Payer: Self-pay | Admitting: Nurse Practitioner

## 2019-05-11 DIAGNOSIS — J069 Acute upper respiratory infection, unspecified: Secondary | ICD-10-CM | POA: Diagnosis not present

## 2019-05-11 DIAGNOSIS — Z20822 Contact with and (suspected) exposure to covid-19: Secondary | ICD-10-CM

## 2019-05-11 MED ORDER — PREDNISONE 20 MG PO TABS: 20.0000 mg | ORAL_TABLET | Freq: Every day | ORAL | 0 refills | Status: DC

## 2019-05-11 MED ORDER — BENZONATATE 200 MG PO CAPS: 200.0000 mg | ORAL_CAPSULE | Freq: Three times a day (TID) | ORAL | 0 refills | Status: DC | PRN

## 2019-05-11 NOTE — Patient Instructions (Signed)
This information is directly available on the CDC website: DiscoHelp.si.html    Source:CDC Reference to specific commercial products, manufacturers, companies, or trademarks does not constitute its endorsement or recommendation by the [place]. Government, Department of Health and [employer], or Centers for [employer] and Prevention. COVID-19: Quarantine vs. Isolation QUARANTINE keeps someone who was in close contact with someone who has COVID-19 away from others. If you had close contact with a person who has COVID-19  Stay home until 14 days after your last contact.  Check your temperature twice a day and watch for symptoms of COVID-19.  If possible, stay away from people who are at higher-risk for getting very sick from COVID-19. ISOLATION keeps someone who is sick or tested positive for COVID-19 without symptoms away from others, even in their own home. If you are sick and think or know you have COVID-19  Stay home until after ? At least 10 days since symptoms first appeared and ? At least 24 hours with no fever without fever-reducing medication and ? Symptoms have improved If you tested positive for COVID-19 but do not have symptoms  Stay home until after ? 10 days have passed since your positive test If you live with others, stay in a specific "sick room" or area and away from other people or animals, including pets. Use a separate bathroom, if available. [employer] 11/22/2018 This information is not intended to replace advice given to you by your health care provider. Make sure you discuss any questions you have with your health care provider. Document Revised: 04/07/2019 Document Reviewed: 04/07/2019 Elsevier Patient Education  2020 [employer].

## 2019-05-11 NOTE — Progress Notes (Signed)
Virtual Visit via Video Note  I connected with Tina Pearson on 05/11/19 at  3:40 PM EST by the video enabled telemedicine application, Doximity, and verified that I am speaking with the correct person using two identifiers.   I discussed the limitations of evaluation and management by telemedicine and the availability of in person appointments. The patient expressed understanding and agreed to proceed.  Subjective:    CC: COVID exposure  HPI: Tina Pearson is a 39 y.o. y/o female presenting via Doximity today for evaluation for recent COVID exposure and possible testing. Patient and her son began exhibiting COVID-19 symptoms Saturday (05/07/19) and were tested on Sunday (05/08/19) at an urgent care - son was positive, but patient was negative. Since that time, the patients grandfather, who lives with her part time, has also tested positive. The patient has had increasingly worsening symptoms. She reports chills, headache, fatigue, congestion, facial pressure, rhinorrhea, productive cough, nausea, and some loss of smell.    Past medical history, Surgical history, Family history not pertinant except as noted below, Social history, Allergies, and medications have been entered into the medical record, reviewed, and corrections made.   Review of Systems:  General: No known fevers or weight loss.   Neuro: No dizziness  HEENT: No difficulty swallowing, vision changes, loss of taste Pulmonary/CV: No shortness of breath, chest pain, palpitations GI: No abdominal pain, vomiting, diarrhea, changes in bowel habits, anorexia   Objective:    General: Speaking clearly in complete sentences without any shortness of breath.  Alert and oriented x3.  Normal judgment. No apparent acute distress. She does sound congested and is frequently coughing.   Impression and Recommendations:    Problem List Items Addressed This Visit    None    Visit Diagnoses    Viral URI with cough    -  Primary   Close exposure to  COVID-19 virus       Relevant Medications   benzonatate (TESSALON) 200 MG capsule   predniSONE (DELTASONE) 20 MG tablet     1. Close exposure to COVID-19 virus Instructions provided for testing at this facility today- will notify patient of results. Instructions to seek emergency care for worsening symptoms or shortness of breath.    2. Viral URI with cough Script for benzonatate and prednisone sent to pharmacy. Instructions for supportive care were provided.    Meds ordered this encounter  Medications  . benzonatate (TESSALON) 200 MG capsule    Sig: Take 1 capsule (200 mg total) by mouth 3 (three) times daily as needed for cough.    Dispense:  30 capsule    Refill:  0    Order Specific Question:   Supervising Provider    Answer:   Stacie Glaze [[id number]]  . predniSONE (DELTASONE) 20 MG tablet    Sig: Take 1 tablet (20 mg total) by mouth daily with breakfast. For 5 days    Dispense:  5 tablet    Refill:  0    Order Specific Question:   Supervising Provider    Answer:   Stacie Glaze [id number]      I discussed the assessment and treatment plan with the patient. The patient was provided an opportunity to ask questions and all were answered. The patient agreed with the plan and demonstrated an understanding of the instructions.   The patient was advised to call back or seek an in-person evaluation if the symptoms worsen or if the condition fails to improve as anticipated.  20  non-face-to-face time was provided during this encounter.   Orma Render, NP

## 2019-05-13 LAB — NOVEL CORONAVIRUS, NAA: SARS-CoV-2, NAA: NOT DETECTED

## 2019-06-01 DIAGNOSIS — N946 Dysmenorrhea, unspecified: Secondary | ICD-10-CM | POA: Diagnosis not present

## 2019-07-21 DIAGNOSIS — Z20822 Contact with and (suspected) exposure to covid-19: Secondary | ICD-10-CM | POA: Diagnosis not present

## 2019-07-21 DIAGNOSIS — R519 Headache, unspecified: Secondary | ICD-10-CM | POA: Diagnosis not present

## 2019-07-21 DIAGNOSIS — R112 Nausea with vomiting, unspecified: Secondary | ICD-10-CM | POA: Diagnosis not present

## 2019-07-21 DIAGNOSIS — R197 Diarrhea, unspecified: Secondary | ICD-10-CM | POA: Diagnosis not present

## 2019-07-25 DIAGNOSIS — Z1231 Encounter for screening mammogram for malignant neoplasm of breast: Secondary | ICD-10-CM | POA: Diagnosis not present

## 2019-07-25 LAB — HM MAMMOGRAPHY

## 2019-08-10 DIAGNOSIS — R928 Other abnormal and inconclusive findings on diagnostic imaging of breast: Secondary | ICD-10-CM | POA: Diagnosis not present

## 2019-08-10 DIAGNOSIS — N6321 Unspecified lump in the left breast, upper outer quadrant: Secondary | ICD-10-CM | POA: Diagnosis not present

## 2019-08-16 DIAGNOSIS — N6321 Unspecified lump in the left breast, upper outer quadrant: Secondary | ICD-10-CM | POA: Diagnosis not present

## 2019-08-16 DIAGNOSIS — D242 Benign neoplasm of left breast: Secondary | ICD-10-CM | POA: Diagnosis not present

## 2019-08-22 ENCOUNTER — Encounter: Payer: Self-pay | Admitting: Family Medicine

## 2019-09-14 ENCOUNTER — Encounter: Payer: Self-pay | Admitting: Family Medicine

## 2019-10-17 ENCOUNTER — Other Ambulatory Visit: Payer: Self-pay | Admitting: Family Medicine

## 2019-10-19 LAB — LIPID PANEL
Cholesterol: 147 (ref 0–200)
HDL: 40 (ref 35–70)

## 2019-10-19 LAB — BASIC METABOLIC PANEL: Glucose: 83

## 2019-11-02 ENCOUNTER — Encounter: Payer: Self-pay | Admitting: Family Medicine

## 2019-11-03 ENCOUNTER — Encounter: Payer: Self-pay | Admitting: Family Medicine

## 2019-11-15 ENCOUNTER — Other Ambulatory Visit: Payer: Self-pay | Admitting: *Deleted

## 2019-11-15 ENCOUNTER — Other Ambulatory Visit: Payer: Self-pay | Admitting: Family Medicine

## 2019-11-15 DIAGNOSIS — N920 Excessive and frequent menstruation with regular cycle: Secondary | ICD-10-CM

## 2020-01-23 ENCOUNTER — Encounter: Payer: Self-pay | Admitting: Family Medicine

## 2020-03-19 ENCOUNTER — Encounter: Payer: Self-pay | Admitting: Nurse Practitioner

## 2020-03-19 ENCOUNTER — Telehealth (INDEPENDENT_AMBULATORY_CARE_PROVIDER_SITE_OTHER): Payer: BC Managed Care – PPO | Admitting: Nurse Practitioner

## 2020-03-19 VITALS — Temp 98.5°F

## 2020-03-19 DIAGNOSIS — Z20822 Contact with and (suspected) exposure to covid-19: Secondary | ICD-10-CM | POA: Diagnosis not present

## 2020-03-19 DIAGNOSIS — J029 Acute pharyngitis, unspecified: Secondary | ICD-10-CM | POA: Diagnosis not present

## 2020-03-19 DIAGNOSIS — R059 Cough, unspecified: Secondary | ICD-10-CM | POA: Diagnosis not present

## 2020-03-19 MED ORDER — HYDROCOD POLST-CPM POLST ER 10-8 MG/5ML PO SUER: 5.0000 mL | Freq: Two times a day (BID) | ORAL | 0 refills | Status: DC | PRN

## 2020-03-19 MED ORDER — AMOXICILLIN-POT CLAVULANATE 875-125 MG PO TABS: 1.0000 | ORAL_TABLET | Freq: Two times a day (BID) | ORAL | 0 refills | Status: DC

## 2020-03-19 NOTE — Progress Notes (Signed)
Virtual Video Visit via MyChart Note  I connected with  Tina Pearson on 03/19/20 at  1:10 PM EST by the video enabled telemedicine application for , MyChart, and verified that I am speaking with the correct person using two identifiers.   I introduced myself as a Publishing rights manager with the practice. We discussed the limitations of evaluation and management by telemedicine and the availability of in person appointments. The patient expressed understanding and agreed to proceed.  The patient is: at home I am: in the office  Subjective:    CC:  Chief Complaint  Patient presents with  . URI    onset:4 days ago, cough, congestion, cough is getting deeper, yellow mucus that is getting darker, tried Dayquil/Nyquil and Tylenol with no relief    HPI: Tina Pearson is a 39 y.o. y/o female presenting via MyChart today for deep, productive cough, dark green mucous production, fatigue, sore throat, and congestion. She reports the cough is so severe she is unable to catch her breath at times and she is not sleeping well.    She reports that she had symptoms similar to this from 10/16 through 10/31. She thought that her symptoms had resolved, however, last week she began to feel bad again and her symptoms has progressively worsened over the weekend.   She denies fever, chills, body aches, loss of taste, loss of smell, headache, or sinus pain and pressure.   She has been using Dayquil and Nyquil and Tessalon Perles with minimal relief of symptoms.   She has not been exposed to COVID that she is aware of, but does have two small children in school. One came home from school sick today.   Past medical history, Surgical history, Family history not pertinant except as noted below, Social history, Allergies, and medications have been entered into the medical record, reviewed, and corrections made.   Review of Systems:  See HPI for pertinent positive and negatives  Objective:    General: Speaking clearly in  complete sentences without any shortness of breath.   Alert and oriented x3.   Normal judgment.  No apparent acute distress. She is audibly congested.  She has a harsh productive cough with visualization of dark green mucous production.   Impression and Recommendations:    1. Cough 2. Sore throat Cough of unknown etiology with an onset nearly 1 month ago that appeared to be resolving but has returned.  Based on presentation and symptoms it is likely that her initial infection was of viral origin that has now converted into a bacterial infection. She will come by the office today for COVID-19 testing to be sure. Treatment with Augmentin for 10 days and Tussionex up to twice a day as needed for severe cough and rest. Recommend continue over-the-counter Mucinex, increased hydration, and plenty of rest. She may also use Tylenol or Ibuprofen as needed for throat pain.  Follow-up by Thursday if symptoms persist. May need to add steroid taper and obtain CXR for evaluation for pneumonia.  - amoxicillin-clavulanate (AUGMENTIN) 875-125 MG tablet; Take 1 tablet by mouth 2 (two) times daily.  Dispense: 20 tablet; Refill: 0 - chlorpheniramine-HYDROcodone (TUSSIONEX) 10-8 MG/5ML SUER; Take 5 mLs by mouth every 12 (twelve) hours as needed for cough (cough, will cause drowsiness.).  Dispense: 120 mL; Refill: 0     I discussed the assessment and treatment plan with the patient. The patient was provided an opportunity to ask questions and all were answered. The patient agreed with the plan and  demonstrated an understanding of the instructions.   The patient was advised to call back or seek an in-person evaluation if the symptoms worsen or if the condition fails to improve as anticipated.  I provided 20 minutes of non-face-to-face interaction with this MYCHART visit including intake, same-day documentation, and chart review.   Tollie Eth, NP

## 2020-03-20 ENCOUNTER — Encounter: Payer: Self-pay | Admitting: Nurse Practitioner

## 2020-03-20 ENCOUNTER — Other Ambulatory Visit: Payer: Self-pay | Admitting: Nurse Practitioner

## 2020-03-21 ENCOUNTER — Other Ambulatory Visit: Payer: Self-pay | Admitting: Nurse Practitioner

## 2020-03-21 DIAGNOSIS — J069 Acute upper respiratory infection, unspecified: Secondary | ICD-10-CM

## 2020-03-21 LAB — SARS-COV-2, NAA 2 DAY TAT

## 2020-03-21 LAB — NOVEL CORONAVIRUS, NAA: SARS-CoV-2, NAA: NOT DETECTED

## 2020-03-21 MED ORDER — GABAPENTIN 100 MG PO CAPS: ORAL_CAPSULE | ORAL | 1 refills | Status: DC

## 2020-03-21 NOTE — Progress Notes (Signed)
Great new, COVID is negative!  How are you feeling?

## 2020-03-22 ENCOUNTER — Encounter: Payer: Self-pay | Admitting: Nurse Practitioner

## 2020-03-22 ENCOUNTER — Ambulatory Visit: Payer: BC Managed Care – PPO | Admitting: Nurse Practitioner

## 2020-03-22 ENCOUNTER — Other Ambulatory Visit: Payer: Self-pay

## 2020-03-22 VITALS — BP 144/88 | HR 89 | Temp 98.1°F | Ht 63.0 in | Wt 133.3 lb

## 2020-03-22 DIAGNOSIS — J189 Pneumonia, unspecified organism: Secondary | ICD-10-CM | POA: Insufficient documentation

## 2020-03-22 DIAGNOSIS — R059 Cough, unspecified: Secondary | ICD-10-CM | POA: Insufficient documentation

## 2020-03-22 MED ORDER — PREDNISONE 20 MG PO TABS: 40.0000 mg | ORAL_TABLET | Freq: Every day | ORAL | 0 refills | Status: AC

## 2020-03-22 MED ORDER — METHYLPREDNISOLONE SODIUM SUCC 125 MG IJ SOLR
125.0000 mg | Freq: Once | INTRAMUSCULAR | Status: AC
  Administered 2020-03-22: 125 mg via INTRAVENOUS

## 2020-03-22 MED ORDER — AZITHROMYCIN 250 MG PO TABS: ORAL_TABLET | ORAL | 0 refills | Status: DC

## 2020-03-22 MED ORDER — ALBUTEROL SULFATE HFA 108 (90 BASE) MCG/ACT IN AERS: 1.0000 | INHALATION_SPRAY | RESPIRATORY_TRACT | 1 refills | Status: DC | PRN

## 2020-03-22 NOTE — Progress Notes (Signed)
Acute Office Visit  Subjective:    Patient ID: Tina Pearson, female    DOB: 1980-07-04, 39 y.o.   MRN: [id number]  Chief Complaint  Patient presents with  . Cough    HPI Tina Pearson is a 39 year old female who presents today with ongoing cough, headache, body aches, congestion, and feeling feverish but no known fever.   She was seen by virtual visit last week for the same symptoms.  She also had an exacerbation of additional symptoms back in October for approximately 2 weeks.  Originally her symptoms resolved but then returned.  We did start her on Augmentin and she was prescribed Tussionex cough syrup to help with her symptoms at the last visit.  Unfortunately she had an allergic reaction to the Tussionex and was unable to take it.  We tried gabapentin for the cough however this was not effective either.  She has also tried [employer] without success.  She does have allergies to over-the-counter cough medications with dextromethorphan.  She has been Covid tested and this was negative.  At this time no one in her house has similar symptoms.  She has been working from home virtually but does report that she has 2 small children who are in preschool.  She tells me that since she was last seen her cough is less productive however it has become deeper and more painful.  She reports that she is experiencing such significant bouts of coughing that it is causing vomiting.  She tells me that she is unable to sleep more than an hour or so at a time due to cough.  She also tells me she is extremely fatigued and worn out.  She does endorse some shortness of breath and wheezing.  Past Medical History:  Diagnosis Date  . Hyperlipidemia   . Migraines   . Post partum thyroiditis    Dr. Morrison Old    Past Surgical History:  Procedure Laterality Date  . VEIN SURGERY Right 01/2015   [facility] and laser.   . WISDOM TOOTH EXTRACTION      Family History  Problem Relation Age of Onset  . Heart  attack Father   . Hyperlipidemia Father   . Heart attack Paternal Grandfather   . Hyperlipidemia Mother   . Stroke Paternal Grandfather     Social History   Socioeconomic History  . Marital status: Married    Spouse name: Not on file  . Number of children: 1  . Years of education: Not on file  . Highest education level: Not on file  Occupational History  . Occupation: Chief Financial Officer    Tobacco Use  . Smoking status: Never Smoker  . Smokeless tobacco: Never Used  Substance and Sexual Activity  . Alcohol use: Not on file    Comment: rare  . Drug use: Not on file  . Sexual activity: Not on file    Comment: Occupational psychologist, runs 2 X week, eats healthy, married, no kids.  Other Topics Concern  . Not on file  Social History Narrative   Some exercise. Drinks caffeine daily.    Social Determinants of Health   Financial Resource Strain:   . Difficulty of Paying Living Expenses: Not on file  Food Insecurity:   . Worried About Programme researcher, broadcasting/film/video in the Last Year: Not on file  . Ran Out of Food in the Last Year: Not on file  Transportation Needs:   . Lack of Transportation (Medical): Not on file  . Lack of  Transportation (Non-Medical): Not on file  Physical Activity:   . Days of Exercise per Week: Not on file  . Minutes of Exercise per Session: Not on file  Stress:   . Feeling of Stress : Not on file  Social Connections:   . Frequency of Communication with Friends and Family: Not on file  . Frequency of Social Gatherings with Friends and Family: Not on file  . Attends Religious Services: Not on file  . Active Member of Clubs or Organizations: Not on file  . Attends Banker Meetings: Not on file  . Marital Status: Not on file  Intimate Partner Violence:   . Fear of Current or Ex-Partner: Not on file  . Emotionally Abused: Not on file  . Physically Abused: Not on file  . Sexually Abused: Not on file    Outpatient Medications Prior to Visit  Medication Sig  Dispense Refill  . amoxicillin-clavulanate (AUGMENTIN) 875-125 MG tablet Take 1 tablet by mouth 2 (two) times daily. 20 tablet 0  . atorvastatin (LIPITOR) 20 MG tablet TAKE 1 TABLET AT BEDTIME 90 tablet 3  . EPINEPHrine 0.3 mg/0.3 mL IJ SOAJ injection Inject into the skin.    . frovatriptan (FROVA) 2.5 MG tablet TAKE 1 TABLET DAILY AS NEEDED FOR MIGRAINE. IF RECURS, MAY REPEAT AFTER 2 HOURS. MAX OF 3 TABLETS IN 24 HOURS. 27 tablet 3  . levonorgestrel-ethinyl estradiol (SEASONALE) 0.15-0.03 MG tablet TAKE 1 TABLET DAILY 273 tablet 2  . gabapentin (NEURONTIN) 100 MG capsule Take 2-3 caps (200-300 mg) at bedtime for cough. May take 1-3 caps (100 mg- 300 mg) two additional times during the day as needed for cough. Start with low doses. Will cause drowsiness. (Patient not taking: Reported on 03/22/2020) 90 capsule 1   No facility-administered medications prior to visit.    Allergies  Allergen Reactions  . Chlorpheniramine-Codeine Other (See Comments)    Headaches, dizziness, palpitations, "drunk feeling"  . Dextromethorphan Other (See Comments)    Migraine headaches,   . Sumatriptan Nausea And Vomiting  . [place] Venom [Bee Venom] Swelling and Rash    Review of Systems All review of systems negative except what is listed in the HPI     Objective:    Physical Exam Vitals and nursing note reviewed.  Constitutional:      General: She is not in acute distress.    Appearance: Normal appearance. She is normal weight. She is ill-appearing.  HENT:     Head: Normocephalic.  Eyes:     Extraocular Movements: Extraocular movements intact.     Conjunctiva/sclera: Conjunctivae normal.     Pupils: Pupils are equal, round, and reactive to light.  Cardiovascular:     Rate and Rhythm: Regular rhythm. Tachycardia present.     Pulses: Normal pulses.     Heart sounds: Normal heart sounds.  Pulmonary:     Effort: Pulmonary effort is normal.     Breath sounds: Wheezing present.     Comments:  Course sounding  Abdominal:     General: Abdomen is flat. Bowel sounds are normal.     Palpations: Abdomen is soft.  Musculoskeletal:        General: Normal range of motion.     Cervical back: Normal range of motion.     Right lower leg: No edema.     Left lower leg: No edema.  Lymphadenopathy:     Cervical: No cervical adenopathy.  Skin:    General: Skin is warm and dry.  Capillary Refill: Capillary refill takes less than 2 seconds.  Neurological:     General: No focal deficit present.     Mental Status: She is alert and oriented to person, place, and time.  Psychiatric:        Mood and Affect: Mood normal.        Behavior: Behavior normal.        Thought Content: Thought content normal.        Judgment: Judgment normal.     BP (!) 144/88   Pulse 89   Temp 98.1 F (36.7 C) (Oral)   Ht 5\' 3"  (1.6 m)   Wt 133 lb 4.8 oz (60.5 kg)   SpO2 97%   BMI 23.61 kg/m  Wt Readings from Last 3 Encounters:  03/22/20 133 lb 4.8 oz (60.5 kg)  05/02/19 130 lb (59 kg)  11/08/18 129 lb (58.5 kg)    There are no preventive care reminders to display for this patient.  There are no preventive care reminders to display for this patient.   Lab Results  Component Value Date   TSH 3.16 05/02/2019   Lab Results  Component Value Date   WBC 6.3 05/02/2019   HGB 14.5 05/02/2019   HCT 43.2 05/02/2019   MCV 90.0 05/02/2019   PLT 252 05/02/2019   Lab Results  Component Value Date   NA 139 05/02/2019   K 4.4 05/02/2019   CO2 24 05/02/2019   GLUCOSE 84 05/02/2019   BUN 8 05/02/2019   CREATININE 0.89 05/02/2019   BILITOT 0.5 05/02/2019   ALKPHOS 66 07/30/2016   AST 14 05/02/2019   ALT 18 05/02/2019   PROT 7.2 05/02/2019   ALBUMIN 4.2 07/30/2016   CALCIUM 9.4 05/02/2019   Lab Results  Component Value Date   CHOL 147 10/19/2019   Lab Results  Component Value Date   HDL 40 10/19/2019   Lab Results  Component Value Date   LDLCALC 191 (H) 05/02/2019   Lab Results   Component Value Date   TRIG 201 (H) 05/02/2019   Lab Results  Component Value Date   CHOLHDL 6.9 (H) 05/02/2019   No results found for: HGBA1C     Assessment & Plan:   Problem List Items Addressed This Visit      Respiratory   Pneumonitis - Primary    Symptoms of presentation consistent with pneumonitis. Lung sounds are course with wheezing throughout the lung fields. No crackles auscultated and no purulent mucous production at this time.  Given the length of time she has had symptoms strongly suspect bacterial etiology as the cause.  We will plan to add amoxicillin to current antibiotic regimen for suspected pneumonia. Steroid injection provided today with prescription to start 5-day steroid burst tomorrow. Prescription for albuterol inhaler also provided for wheezing and cough. Discussed with patient that he should expect symptom improvement within the next few days but or resolution of cough could potentially take several weeks. Patient instructed to contact the office if her symptoms worsen at any time or if they have not improved by Monday.  CXR will be needed at that point.       Relevant Medications   azithromycin (ZITHROMAX) 250 MG tablet   predniSONE (DELTASONE) 20 MG tablet   albuterol (VENTOLIN HFA) 108 (90 Base) MCG/ACT inhaler       Meds ordered this encounter  Medications  . azithromycin (ZITHROMAX) 250 MG tablet    Sig: Take 2 tabs (500 mg) together on the first  day, then 1 tab (250 mg) daily until prescription complete.    Dispense:  10 tablet    Refill:  0  . methylPREDNISolone sodium succinate (SOLU-MEDROL) 125 mg/2 mL injection 125 mg  . predniSONE (DELTASONE) 20 MG tablet    Sig: Take 2 tablets (40 mg total) by mouth daily with breakfast for 5 days.    Dispense:  10 tablet    Refill:  0  . albuterol (VENTOLIN HFA) 108 (90 Base) MCG/ACT inhaler    Sig: Inhale 1-2 puffs into the lungs every 4 (four) hours as needed for wheezing or shortness of breath.     Dispense:  6.7 g    Refill:  1     Tollie EthSara E. Salimata Christenson, NP

## 2020-03-22 NOTE — Patient Instructions (Signed)
Bronchospasm, Adult  Bronchospasm is when airways in the lungs get smaller. When this happens, it can be hard to breathe. You may cough. You may also make a whistling sound when you breathe (wheeze). Follow these instructions at home: Medicines  Take over-the-counter and prescription medicines only as told by your doctor.  If you need to use an inhaler or nebulizer to take your medicine, ask your doctor how to use it.  If you were given a spacer, always use it with your inhaler. Lifestyle  Change your heating and air conditioning filter. Do this at least once a month.  Try not to use fireplaces and wood stoves.  Do not  smoke. Do not  allow smoking in your home.  Try not to use things that have a strong smell, like perfume.  Get rid of pests (such as roaches and mice) and their poop.  Remove any mold from your home.  Keep your house clean. Get rid of dust.  Use cleaning products that have no smell.  Replace carpet with wood, tile, or vinyl flooring.  Use allergy-proof pillows, mattress covers, and box spring covers.  Wash bed sheets and blankets every week. Use hot water. Dry them in a dryer.  Use blankets that are made of polyester or cotton.  Wash your hands often.  Keep pets out of your bedroom.  When you exercise, try not to breathe in cold air. General instructions  Have a plan for getting medical care. Know these things: ? When to call your doctor. ? When to call local emergency services (911 in the [place] ? Where to go in an emergency.  Stay up to date on your shots (immunizations).  When you have an episode: ? Stay calm. ? Relax. ? Breathe slowly. Contact a doctor if:  Your muscles ache.  Your chest hurts.  The color of the mucus you cough up (sputum) changes from clear or white to yellow, green, gray, or bloody.  The mucus you cough up gets thicker.  You have a fever. Get help right away if:  The whistling sound gets worse, even after you  take your medicines.  Your coughing gets worse.  You find it even harder to breathe.  Your chest hurts very much. Summary  Bronchospasm is when airways in the lungs get smaller.  When this happens, it can be hard to breathe. You may cough. You may also make a whistling sound when you breathe.  Stay away from things that cause you to have episodes. These include smoke or dust. This information is not intended to replace advice given to you by your health care provider. Make sure you discuss any questions you have with your health care provider. Document Revised: 04/03/2017 Document Reviewed: 04/24/2016 Elsevier Patient Education  2020 [employer]   Pneumonitis  Pneumonitis is inflammation of the lungs. Infection or exposure to certain substances or allergens can cause this condition. Allergens are substances that you are allergic to. What are the causes? This condition may be caused by:  An infection from bacteria or a virus (pneumonia).  Exposure to certain substances in the workplace. This includes working on farms and in certain industries. Some substances that can cause this condition include asbestos, silica, inhaled acids, or inhaled chlorine gas.  Repeated exposure to bird feathers, bird feces, or other allergens.  Medicines such as chemotherapy drugs, certain antibiotic medicines, and some heart medicines.  Radiation therapy.  Exposure to mold. A hot tub, sauna, or home humidifier can have  mold growing in it, even if it looks clean. You can breathe in the mold through water vapor.  Breathing in (aspirating) stomach contents, food, or liquids into the lungs. What are the signs or symptoms? Symptoms of this condition include:  Shortness of breath or trouble breathing. This is the most common symptom.  Cough.  Fever.  Decreased energy.  Decreased appetite. How is this diagnosed? This condition may be diagnosed based on:  Your medical history.  Physical  exam.  Blood tests.  Other tests, including: ? Pulmonary function test (PFT). This measures how well your lungs work. ? Chest X-ray. ? CT scan of the lungs. ? Bronchoscopy. In this procedure, your health care provider looks at your airways through an instrument called a bronchoscope. ? Lung biopsy. In this procedure, your health care provider takes a small piece of tissue from your lungs to examine it. How is this treated? Treatment depends on the cause of the condition. If the cause is exposure to a substance, avoiding further exposure to that substance will help reduce your symptoms. Possible medical treatments for pneumonitis include:  Corticosteroid medicine to help decrease inflammation.  Antibiotic medicine to help fight an infection caused by bacteria.  Bronchodilators or inhalers to help relax the muscles and make breathing easier.  Oxygen therapy, if you are having trouble breathing. Follow these instructions at home:  Take or use over-the-counter and prescription medicines only as told by your health care provider. This includes any inhaler use.  Avoid exposure to any substance that caused your pneumonitis. If you need to work with substances that can cause pneumonitis, wear a mask to protect your lungs.  If you were prescribed an antibiotic, take it as told by your health care provider. Do not stop taking the antibiotic even if you start to feel better.  If you were prescribed an inhaler, keep it with you at all times.  Do not use any products that contain nicotine or tobacco, such as cigarettes and e-cigarettes. If you need help quitting, ask your health care provider.  Keep all follow-up visits as told by your health care provider. This is important. Contact a health care provider if:  You have a fever.  Your symptoms get worse. Get help right away if:  You have new or worse shortness of breath.  You develop a blue color (cyanosis) under your  fingernails. Summary  Pneumonitis is inflammation of the lungs. This condition can be caused by infection or exposure to certain substances or allergens.  The most common symptom of this condition is shortness of breath or trouble breathing.  Treatment depends on the cause of your condition. This information is not intended to replace advice given to you by your health care provider. Make sure you discuss any questions you have with your health care provider. Document Revised: 04/03/2017 Document Reviewed: 03/13/2016 Elsevier Patient Education  2020 [employer].

## 2020-03-22 NOTE — Assessment & Plan Note (Signed)
Symptoms of presentation consistent with pneumonitis. Lung sounds are course with wheezing throughout the lung fields. No crackles auscultated and no purulent mucous production at this time.  Given the length of time she has had symptoms strongly suspect bacterial etiology as the cause.  We will plan to add amoxicillin to current antibiotic regimen for suspected pneumonia. Steroid injection provided today with prescription to start 5-day steroid burst tomorrow. Prescription for albuterol inhaler also provided for wheezing and cough. Discussed with patient that he should expect symptom improvement within the next few days but or resolution of cough could potentially take several weeks. Patient instructed to contact the office if her symptoms worsen at any time or if they have not improved by Monday.  CXR will be needed at that point.

## 2020-03-26 ENCOUNTER — Other Ambulatory Visit: Payer: Self-pay | Admitting: Family Medicine

## 2020-04-02 ENCOUNTER — Ambulatory Visit (INDEPENDENT_AMBULATORY_CARE_PROVIDER_SITE_OTHER): Payer: BC Managed Care – PPO

## 2020-04-02 ENCOUNTER — Other Ambulatory Visit: Payer: Self-pay

## 2020-04-02 ENCOUNTER — Other Ambulatory Visit: Payer: Self-pay | Admitting: Nurse Practitioner

## 2020-04-02 DIAGNOSIS — R059 Cough, unspecified: Secondary | ICD-10-CM

## 2020-04-02 DIAGNOSIS — J189 Pneumonia, unspecified organism: Secondary | ICD-10-CM | POA: Diagnosis not present

## 2020-04-02 DIAGNOSIS — R509 Fever, unspecified: Secondary | ICD-10-CM

## 2020-04-02 IMAGING — DX DG CHEST 2V
2 series · 2 of 2 positions shown · non-contrast
Comparison: None.

CLINICAL DATA: Cough with fevers, negative [1I] testing

EXAM:
CHEST - 2 VIEW

[chest pa]
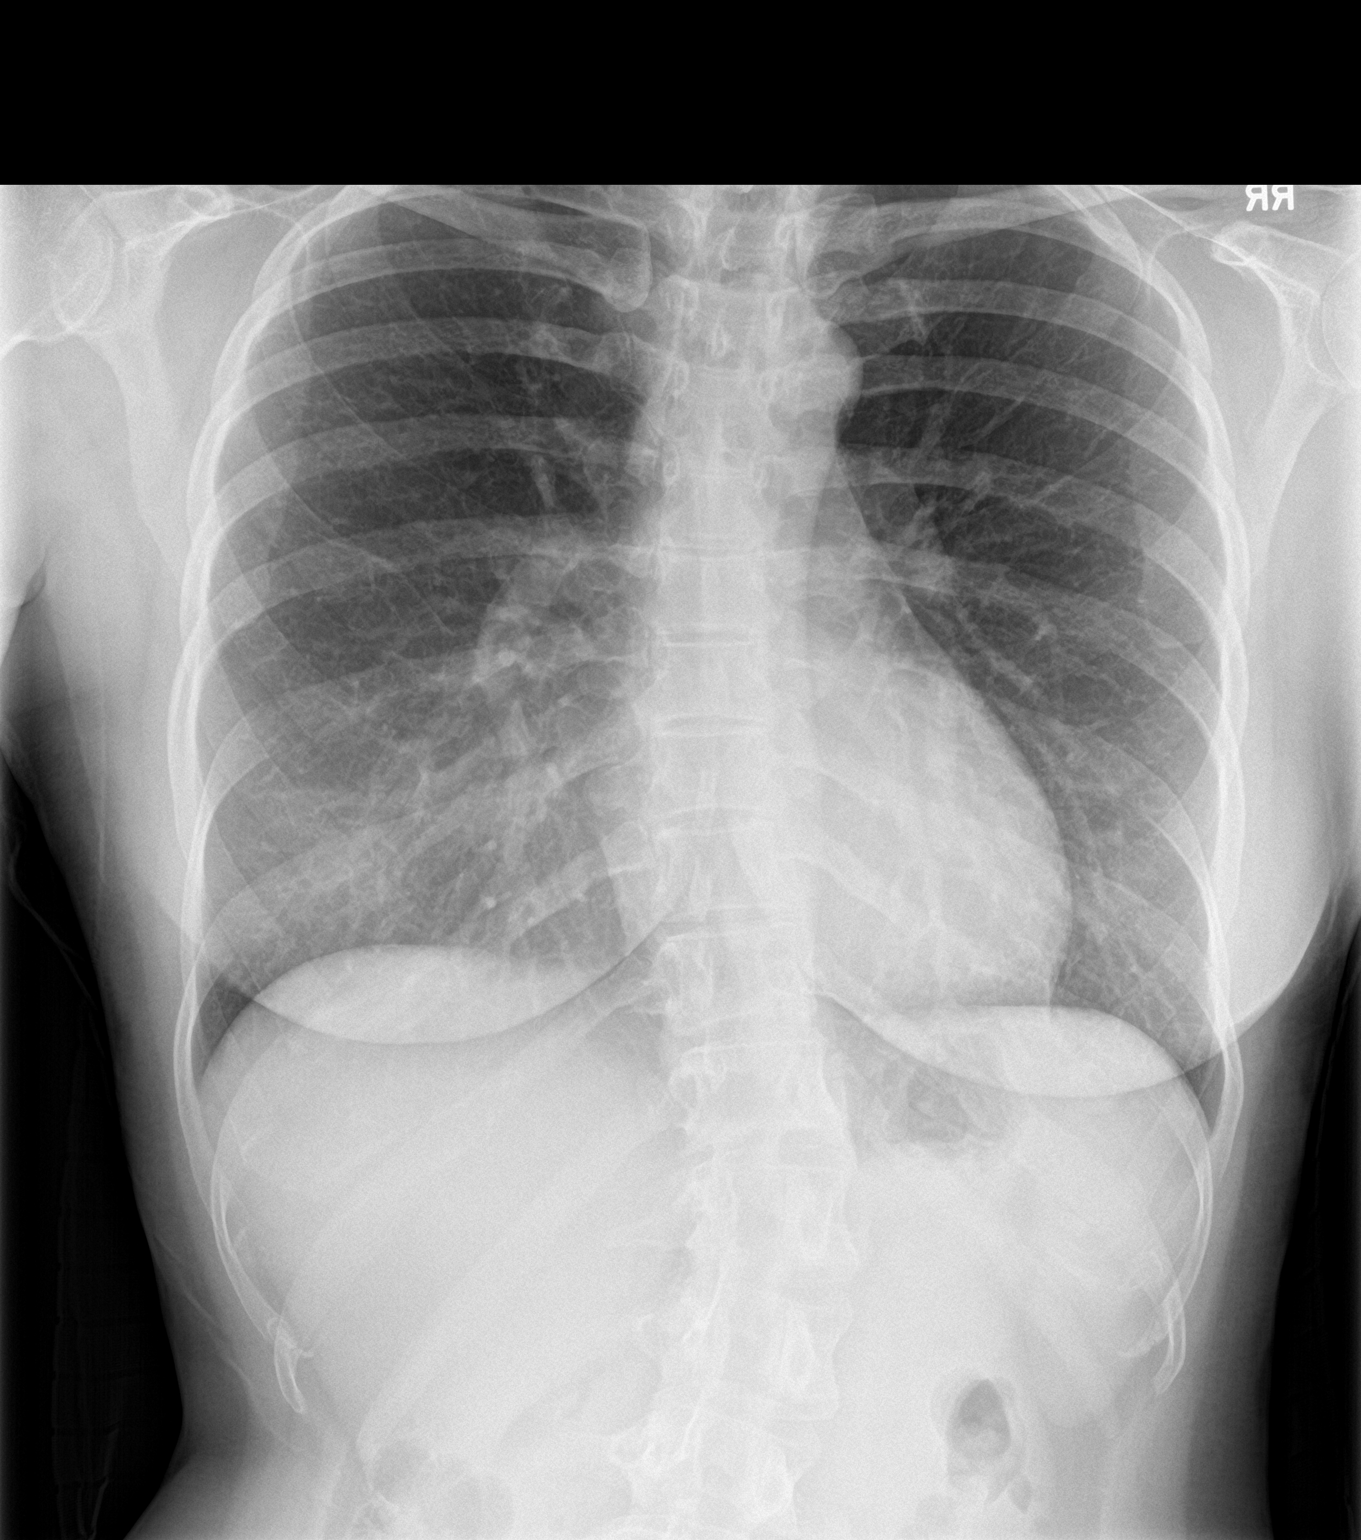

[chest lat]
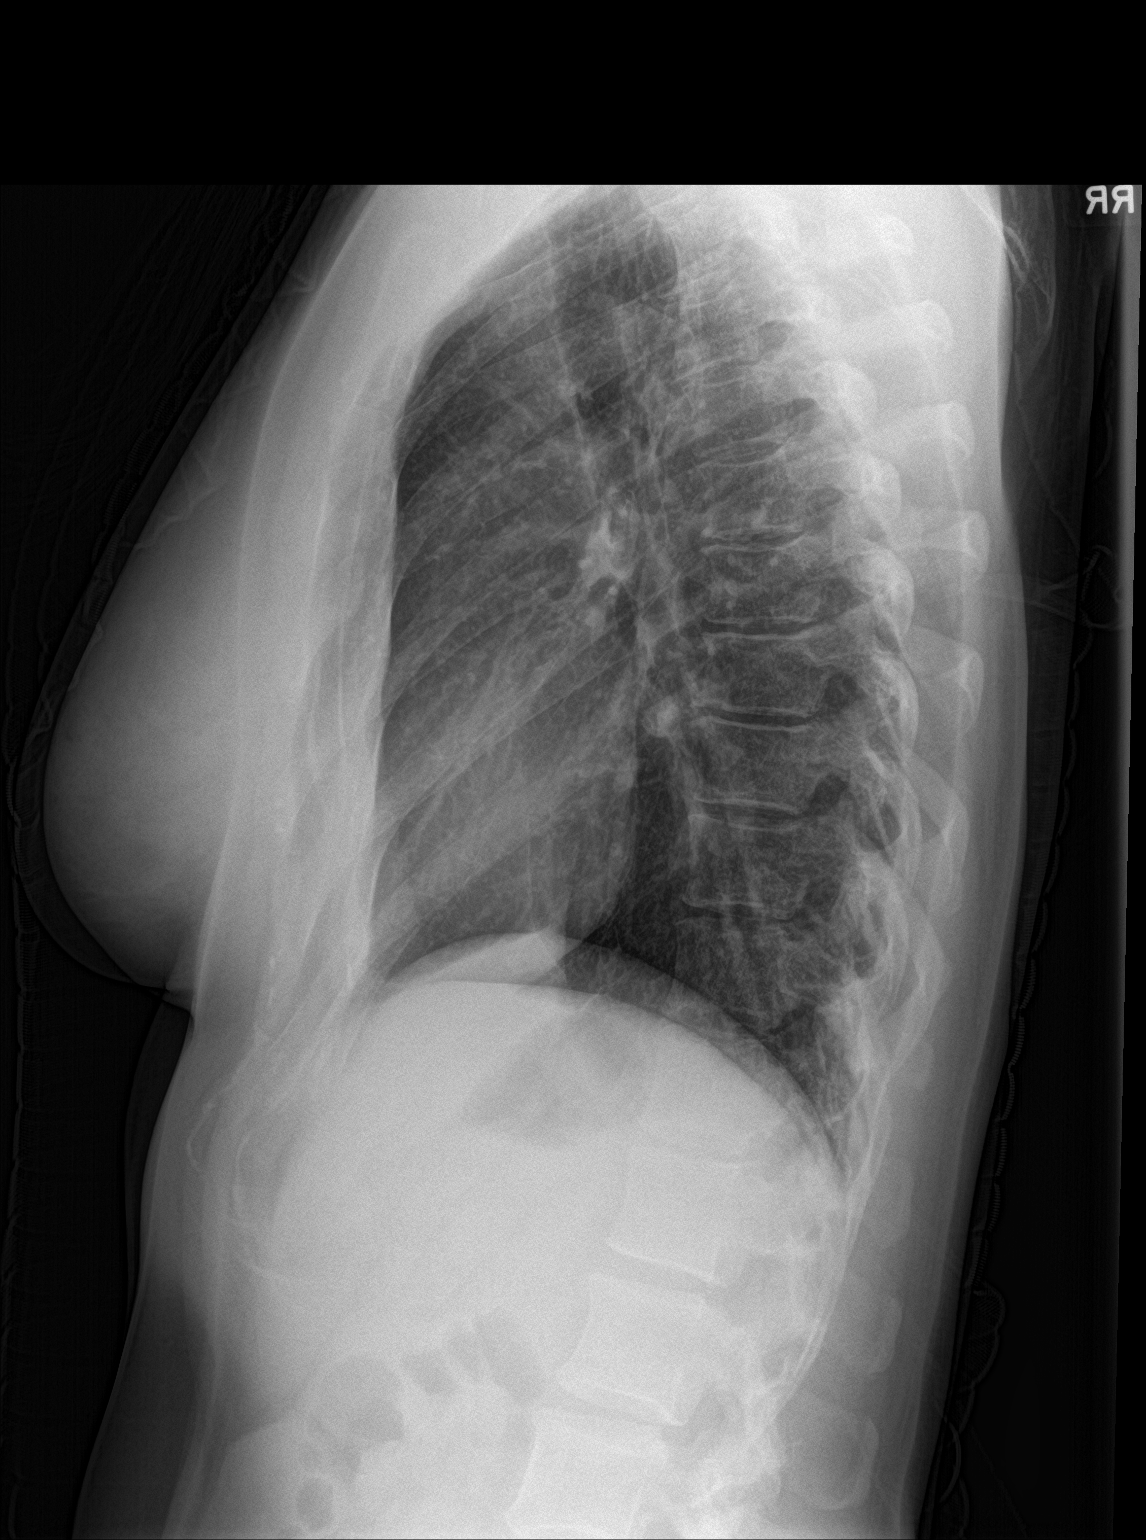

[2 of 2 positions shown; findings below may reference images not displayed]

FINDINGS: Mild airways thickening is present. Some hazy opacity in the right
infrahilar lung while this may be accentuated by overlying breast
shadow, there is partial obscuration of the right heart border which
is suggestive of airspace disease in the right middle lobe as well
as associated airways thickening. Lungs are otherwise clear. No
pneumothorax or effusion. The cardiomediastinal contours are
unremarkable. Scoliotic curvature of the spine pronounced
levocurvature at the thoracolumbar junction, apex L1.
IMPRESSION: 1. Mild airways thickening with opacity in the right middle lobe,
suspicious for pneumonia.

## 2020-04-03 ENCOUNTER — Other Ambulatory Visit: Payer: Self-pay | Admitting: Nurse Practitioner

## 2020-04-03 DIAGNOSIS — R059 Cough, unspecified: Secondary | ICD-10-CM | POA: Diagnosis not present

## 2020-04-03 DIAGNOSIS — R5081 Fever presenting with conditions classified elsewhere: Secondary | ICD-10-CM

## 2020-04-03 DIAGNOSIS — J189 Pneumonia, unspecified organism: Secondary | ICD-10-CM | POA: Diagnosis not present

## 2020-04-03 MED ORDER — LEVOFLOXACIN 500 MG PO TABS: 500.0000 mg | ORAL_TABLET | Freq: Every day | ORAL | 0 refills | Status: DC

## 2020-04-03 NOTE — Addendum Note (Signed)
Addended by: Morry Veiga, Huntley Dec E on: 04/03/2020 07:35 AM   Modules accepted: Orders

## 2020-04-03 NOTE — Progress Notes (Signed)
Tina Pearson,   Your chest x-ray does show the presence of pneumonia in the right middle lobe. Typically a single lobe pneumonia is suggestive of strep pneumonia, which is very common, but because your symptoms have been going on so long and you are still having fevers despite antibiotics, I would like to see if we can get a sputum culture to make sure that we are treating the right organism. Are you coughing anything up that you could come to the lab and give a sample? I spoke with Dr. Linford Arnold and she suggested we also draw blood cultures to make sure that this is not infection more than just the lungs. You can have that done in the lab, too. I will put the orders in and you can come by any time today to have this done.  I am going to go ahead and send in a strong antibiotic to hopefully get this knocked out. I will let you know if we have to change the antibiotic based on the cultures.  Please let me know if your symptoms do not improve or if you continue to have fevers on Friday or later.   Please let me know if you have any questions. SaraBeth

## 2020-04-06 ENCOUNTER — Encounter: Payer: Self-pay | Admitting: Nurse Practitioner

## 2020-04-09 DIAGNOSIS — Z793 Long term (current) use of hormonal contraceptives: Secondary | ICD-10-CM | POA: Diagnosis not present

## 2020-04-09 DIAGNOSIS — R0602 Shortness of breath: Secondary | ICD-10-CM | POA: Diagnosis not present

## 2020-04-09 DIAGNOSIS — Z91030 Bee allergy status: Secondary | ICD-10-CM | POA: Diagnosis not present

## 2020-04-09 DIAGNOSIS — Z20822 Contact with and (suspected) exposure to covid-19: Secondary | ICD-10-CM | POA: Diagnosis not present

## 2020-04-09 DIAGNOSIS — R059 Cough, unspecified: Secondary | ICD-10-CM | POA: Diagnosis not present

## 2020-04-09 DIAGNOSIS — E785 Hyperlipidemia, unspecified: Secondary | ICD-10-CM | POA: Diagnosis not present

## 2020-04-09 DIAGNOSIS — Z886 Allergy status to analgesic agent status: Secondary | ICD-10-CM | POA: Diagnosis not present

## 2020-04-09 DIAGNOSIS — R091 Pleurisy: Secondary | ICD-10-CM | POA: Diagnosis not present

## 2020-04-09 DIAGNOSIS — R0789 Other chest pain: Secondary | ICD-10-CM | POA: Diagnosis not present

## 2020-04-09 DIAGNOSIS — J189 Pneumonia, unspecified organism: Secondary | ICD-10-CM | POA: Diagnosis not present

## 2020-04-09 LAB — CULTURE, BLOOD (SINGLE): MICRO NUMBER:: 11268561

## 2020-04-09 NOTE — Progress Notes (Signed)
Tina Pearson,   Your blood cultures did not show any growth after 5 days, which is great.   How are you feeling? Any changes?

## 2020-04-10 ENCOUNTER — Ambulatory Visit: Payer: BC Managed Care – PPO | Admitting: Family Medicine

## 2020-04-24 ENCOUNTER — Encounter: Payer: Self-pay | Admitting: Family Medicine

## 2020-05-01 ENCOUNTER — Other Ambulatory Visit: Payer: Self-pay

## 2020-05-01 ENCOUNTER — Encounter: Payer: Self-pay | Admitting: Family Medicine

## 2020-05-01 ENCOUNTER — Other Ambulatory Visit (HOSPITAL_COMMUNITY)
Admission: RE | Admit: 2020-05-01 | Discharge: 2020-05-01 | Disposition: A | Discharge status: Home / Self Care | Payer: BC Managed Care – PPO | Source: Ambulatory Visit | Attending: Family Medicine | Admitting: Family Medicine

## 2020-05-01 ENCOUNTER — Ambulatory Visit (INDEPENDENT_AMBULATORY_CARE_PROVIDER_SITE_OTHER): Payer: BC Managed Care – PPO | Admitting: Family Medicine

## 2020-05-01 VITALS — BP 142/85 | HR 86 | Ht 63.0 in | Wt 130.0 lb

## 2020-05-01 DIAGNOSIS — Z124 Encounter for screening for malignant neoplasm of cervix: Secondary | ICD-10-CM

## 2020-05-01 DIAGNOSIS — Z0000 Encounter for general adult medical examination without abnormal findings: Secondary | ICD-10-CM | POA: Insufficient documentation

## 2020-05-01 DIAGNOSIS — Z8639 Personal history of other endocrine, nutritional and metabolic disease: Secondary | ICD-10-CM | POA: Diagnosis not present

## 2020-05-01 DIAGNOSIS — E041 Nontoxic single thyroid nodule: Secondary | ICD-10-CM | POA: Diagnosis not present

## 2020-05-01 NOTE — Progress Notes (Signed)
Subjective:     Tina Pearson is a 39 y.o. female and is here for a comprehensive physical exam. The patient reports no problems.  She is normally physically active and works out until recently when she developed pneumonia.  But she says she is finally getting much better from that.  Social History   Socioeconomic History  . Marital status: Married    Spouse name: Not on file  . Number of children: 1  . Years of education: Not on file  . Highest education level: Not on file  Occupational History  . Occupation: Chief Financial Officer    Tobacco Use  . Smoking status: Never Smoker  . Smokeless tobacco: Never Used  Substance and Sexual Activity  . Alcohol use: Not on file    Comment: rare  . Drug use: Not on file  . Sexual activity: Not on file    Comment: Occupational psychologist, runs 2 X week, eats healthy, married, no kids.  Other Topics Concern  . Not on file  Social History Narrative   Some exercise. Drinks caffeine daily.    Social Determinants of Health   Financial Resource Strain: Not on file  Food Insecurity: Not on file  Transportation Needs: Not on file  Physical Activity: Not on file  Stress: Not on file  Social Connections: Not on file  Intimate Partner Violence: Not on file   Health Maintenance  Topic Date Due  . Hepatitis C Screening  03/19/2021 (Originally 1981-02-18)  . PAP SMEAR-Modifier  09/15/2021  . TETANUS/TDAP  01/10/2027  . INFLUENZA VACCINE  Completed  . COVID-19 Vaccine  Completed  . HIV Screening  Completed    The following portions of the patient's history were reviewed and updated as appropriate: allergies, current medications, past family history, past medical history, past social history, past surgical history and problem list.  Review of Systems A comprehensive review of systems was negative.   Objective:    BP (!) 142/85   Pulse 86   Ht 5\' 3"  (1.6 m)   Wt 130 lb (59 kg)   SpO2 99%   BMI 23.03 kg/m  General appearance: alert, cooperative and  appears stated age Head: Normocephalic, without obvious abnormality, atraumatic Eyes: conj clear, EOMi, PEERLA Ears: normal TM's and external ear canals both ears Nose: Nares normal. Septum midline. Mucosa normal. No drainage or sinus tenderness. Throat: lips, mucosa, and tongue normal; teeth and gums normal Neck: no adenopathy, no carotid bruit, no JVD, supple, symmetrical, trachea midline and thyroid not enlarged, symmetric, no tenderness/mass/nodules Back: symmetric, no curvature. ROM normal. No CVA tenderness. Lungs: clear to auscultation bilaterally Breasts: normal appearance, no masses or tenderness Heart: regular rate and rhythm, S1, S2 normal, no murmur, click, rub or gallop Abdomen: soft, non-tender; bowel sounds normal; no masses,  no organomegaly Pelvic: cervix normal in appearance, external genitalia normal, no adnexal masses or tenderness, no cervical motion tenderness, rectovaginal septum normal, uterus normal size, shape, and consistency and vagina normal without discharge Extremities: extremities normal, atraumatic, no cyanosis or edema Pulses: 2+ and symmetric Skin: Skin color, texture, turgor normal. No rashes or lesions Lymph nodes: Cervical, supraclavicular, and axillary nodes normal. Neurologic: Alert and oriented X 3, normal strength and tone. Normal symmetric reflexes. Normal coordination and gait    Assessment:    Healthy female exam.      Plan:     See After Visit Summary for Counseling Recommendations   Keep up a regular exercise program and make sure you are eating a healthy  diet Try to eat 4 servings of dairy a day, or if you are lactose intolerant take a calcium with vitamin D daily.  Your vaccines are up to date.  Due for labs.   Pap performed today.   Tdap is up to date.

## 2020-05-01 NOTE — Addendum Note (Signed)
Addended by: Deno Etienne on: 05/01/2020 10:19 AM   Modules accepted: Orders

## 2020-05-02 LAB — CYTOLOGY - PAP
Comment: NEGATIVE
Diagnosis: NEGATIVE
High risk HPV: NEGATIVE

## 2020-05-03 NOTE — Telephone Encounter (Signed)
Dr. Linford Arnold,  I don't see where the TSH was ordered so I called [facility] and spoke with Reuel Boom.  He added the TSH using diagnosis code E04.1 and Z86.39.  LDJ#[id number] R

## 2020-05-05 LAB — COMPLETE METABOLIC PANEL WITH GFR
AG Ratio: 1.5 (calc) (ref 1.0–2.5)
ALT: 11 U/L (ref 6–29)
AST: 14 U/L (ref 10–30)
Albumin: 4.2 g/dL (ref 3.6–5.1)
Alkaline phosphatase (APISO): 78 U/L (ref 31–125)
BUN: 9 mg/dL (ref 7–25)
CO2: 26 mmol/L (ref 20–32)
Calcium: 9.3 mg/dL (ref 8.6–10.2)
Chloride: 106 mmol/L (ref 98–110)
Creat: 0.86 mg/dL (ref 0.50–1.10)
GFR, Est African American: 99 mL/min/{1.73_m2} (ref >=60)
GFR, Est Non African American: 85 mL/min/{1.73_m2} (ref >=60)
Globulin: 2.8 g/dL (calc) (ref 1.9–3.7)
Glucose, Bld: 87 mg/dL (ref 65–99)
Potassium: 3.8 mmol/L (ref 3.5–5.3)
Sodium: 139 mmol/L (ref 135–146)
Total Bilirubin: 0.5 mg/dL (ref 0.2–1.2)
Total Protein: 7 g/dL (ref 6.1–8.1)

## 2020-05-05 LAB — TEST AUTHORIZATION

## 2020-05-05 LAB — LIPID PANEL
Cholesterol: 163 mg/dL (ref <200)
HDL: 40 mg/dL — ABNORMAL LOW (ref >=50)
LDL Cholesterol (Calc): 100 mg/dL (calc) — ABNORMAL HIGH
Non-HDL Cholesterol (Calc): 123 mg/dL (calc) (ref <130)
Total CHOL/HDL Ratio: 4.1 (calc) (ref <5.0)
Triglycerides: 126 mg/dL (ref <150)

## 2020-05-05 LAB — CBC
HCT: 41.7 % (ref 35.0–45.0)
Hemoglobin: 14.4 g/dL (ref 11.7–15.5)
MCH: 30.9 pg (ref 27.0–33.0)
MCHC: 34.5 g/dL (ref 32.0–36.0)
MCV: 89.5 fL (ref 80.0–100.0)
MPV: 12.7 fL — ABNORMAL HIGH (ref 7.5–12.5)
Platelets: 240 10*3/uL (ref 140–400)
RBC: 4.66 10*6/uL (ref 3.80–5.10)
RDW: 12 % (ref 11.0–15.0)
WBC: 5.9 10*3/uL (ref 3.8–10.8)

## 2020-05-05 LAB — TSH: TSH: 1.36 mIU/L

## 2020-05-11 DIAGNOSIS — J069 Acute upper respiratory infection, unspecified: Secondary | ICD-10-CM | POA: Diagnosis not present

## 2020-05-11 DIAGNOSIS — Z20822 Contact with and (suspected) exposure to covid-19: Secondary | ICD-10-CM | POA: Diagnosis not present

## 2020-05-15 ENCOUNTER — Ambulatory Visit: Payer: BC Managed Care – PPO

## 2020-07-23 ENCOUNTER — Encounter: Payer: Self-pay | Admitting: Family Medicine

## 2020-07-23 ENCOUNTER — Ambulatory Visit: Payer: BC Managed Care – PPO | Admitting: Family Medicine

## 2020-07-23 ENCOUNTER — Other Ambulatory Visit: Payer: Self-pay

## 2020-07-23 ENCOUNTER — Ambulatory Visit (INDEPENDENT_AMBULATORY_CARE_PROVIDER_SITE_OTHER): Payer: BC Managed Care – PPO

## 2020-07-23 VITALS — BP 156/97 | HR 95

## 2020-07-23 DIAGNOSIS — Z09 Encounter for follow-up examination after completed treatment for conditions other than malignant neoplasm: Secondary | ICD-10-CM

## 2020-07-23 DIAGNOSIS — M25562 Pain in left knee: Secondary | ICD-10-CM

## 2020-07-23 DIAGNOSIS — M25462 Effusion, left knee: Secondary | ICD-10-CM

## 2020-07-23 DIAGNOSIS — M1712 Unilateral primary osteoarthritis, left knee: Secondary | ICD-10-CM | POA: Diagnosis not present

## 2020-07-23 DIAGNOSIS — M1711 Unilateral primary osteoarthritis, right knee: Secondary | ICD-10-CM | POA: Diagnosis not present

## 2020-07-23 IMAGING — DX DG KNEE 1-2V*R*
2 series · 2 of 2 positions shown · non-contrast
Comparison: None.

CLINICAL DATA: Left medial and posterior knee pain.

EXAM:
LEFT KNEE - COMPLETE 4+ VIEW; RIGHT KNEE - 1-2 VIEW

[tunnel]
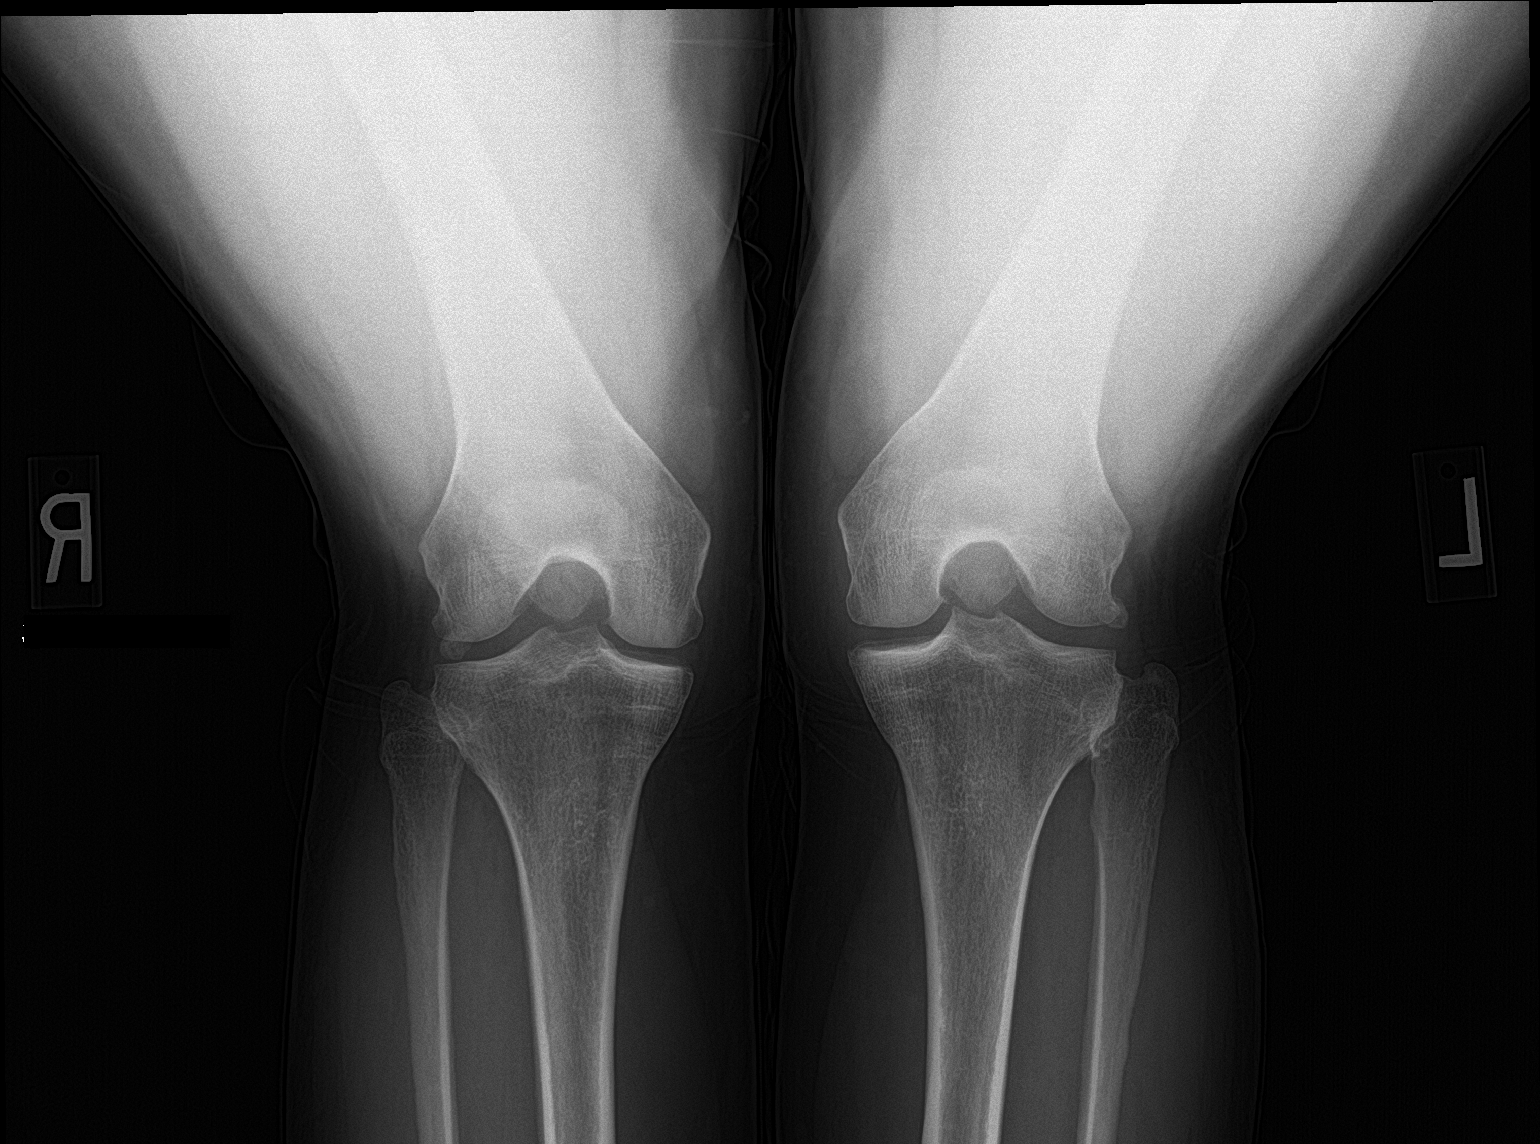

[knee ap bilat standing]
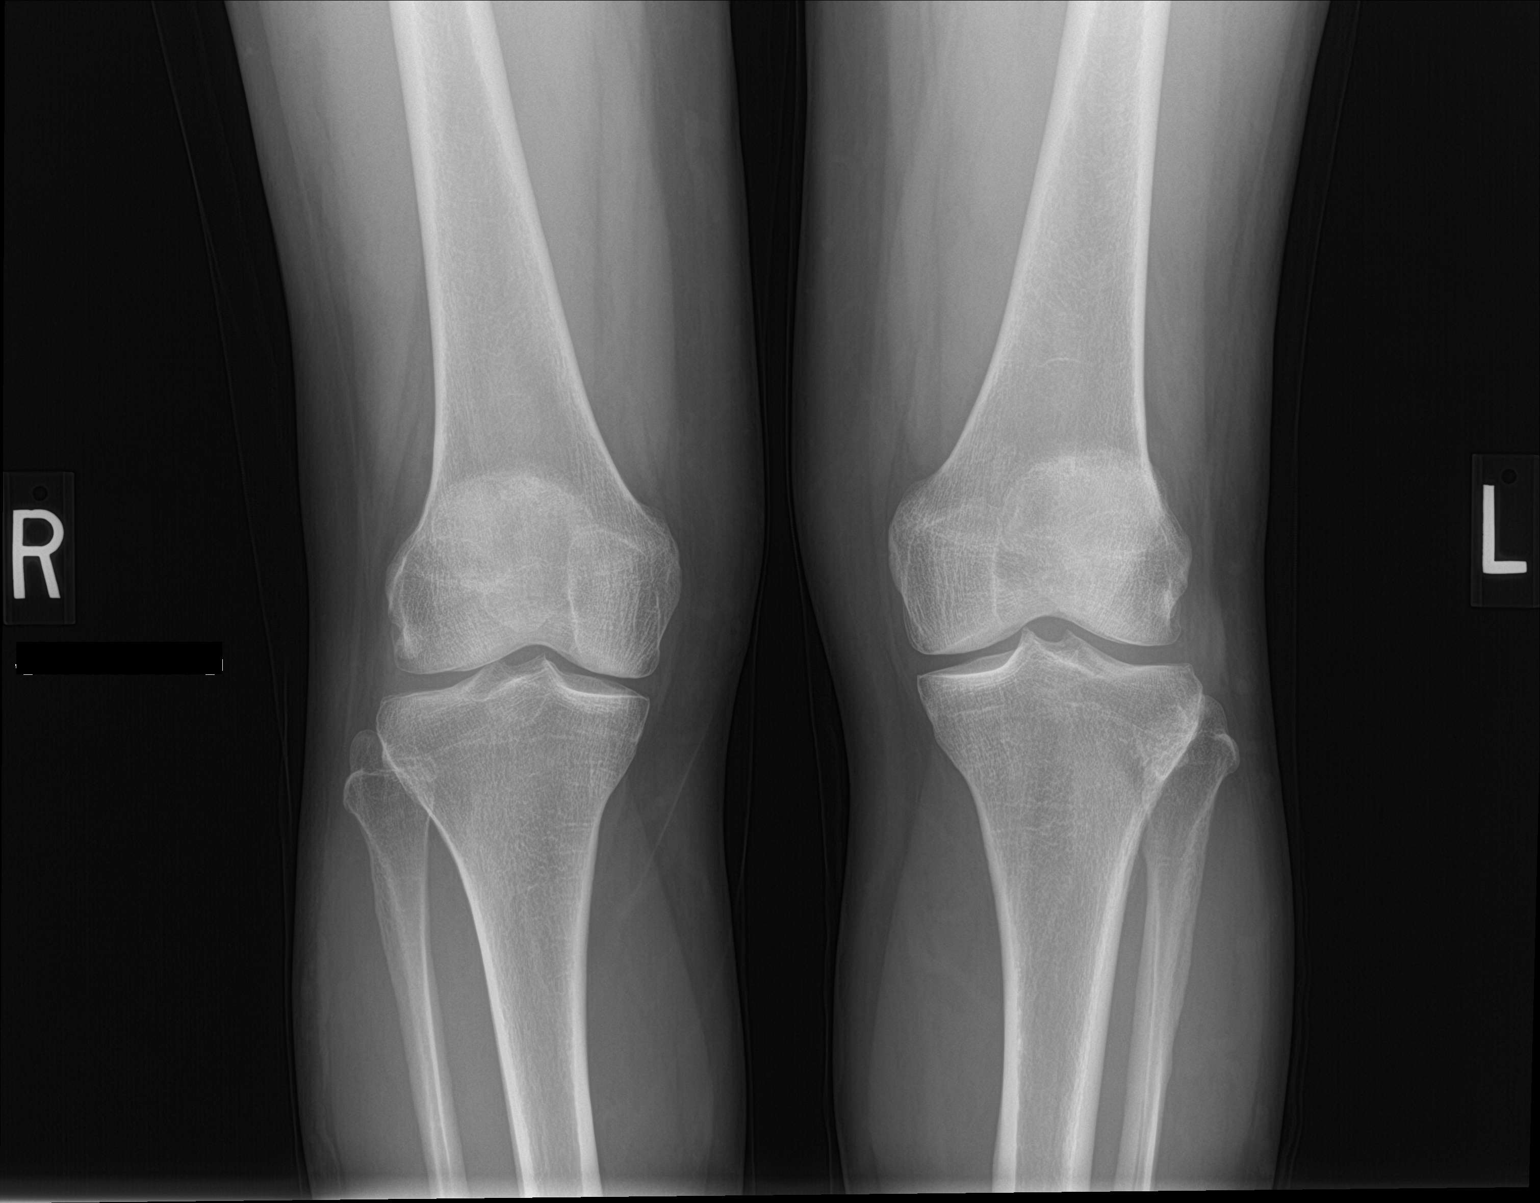

[2 of 2 positions shown; findings below may reference images not displayed]

FINDINGS: There are mild tricompartmental degenerative changes of both knees.
There is no acute displaced fracture or dislocation. There is a
small joint effusion on the left.
IMPRESSION: 1. No acute displaced fracture or dislocation.
2. Mild tricompartmental degenerative changes of both knees.
3. Small left joint effusion.

## 2020-07-23 IMAGING — DX DG KNEE COMPLETE 4+V*L*
4 series · 4 of 4 positions shown · non-contrast
Comparison: None.

CLINICAL DATA: Left medial and posterior knee pain.

EXAM:
LEFT KNEE - COMPLETE 4+ VIEW; RIGHT KNEE - 1-2 VIEW

[tunnel]
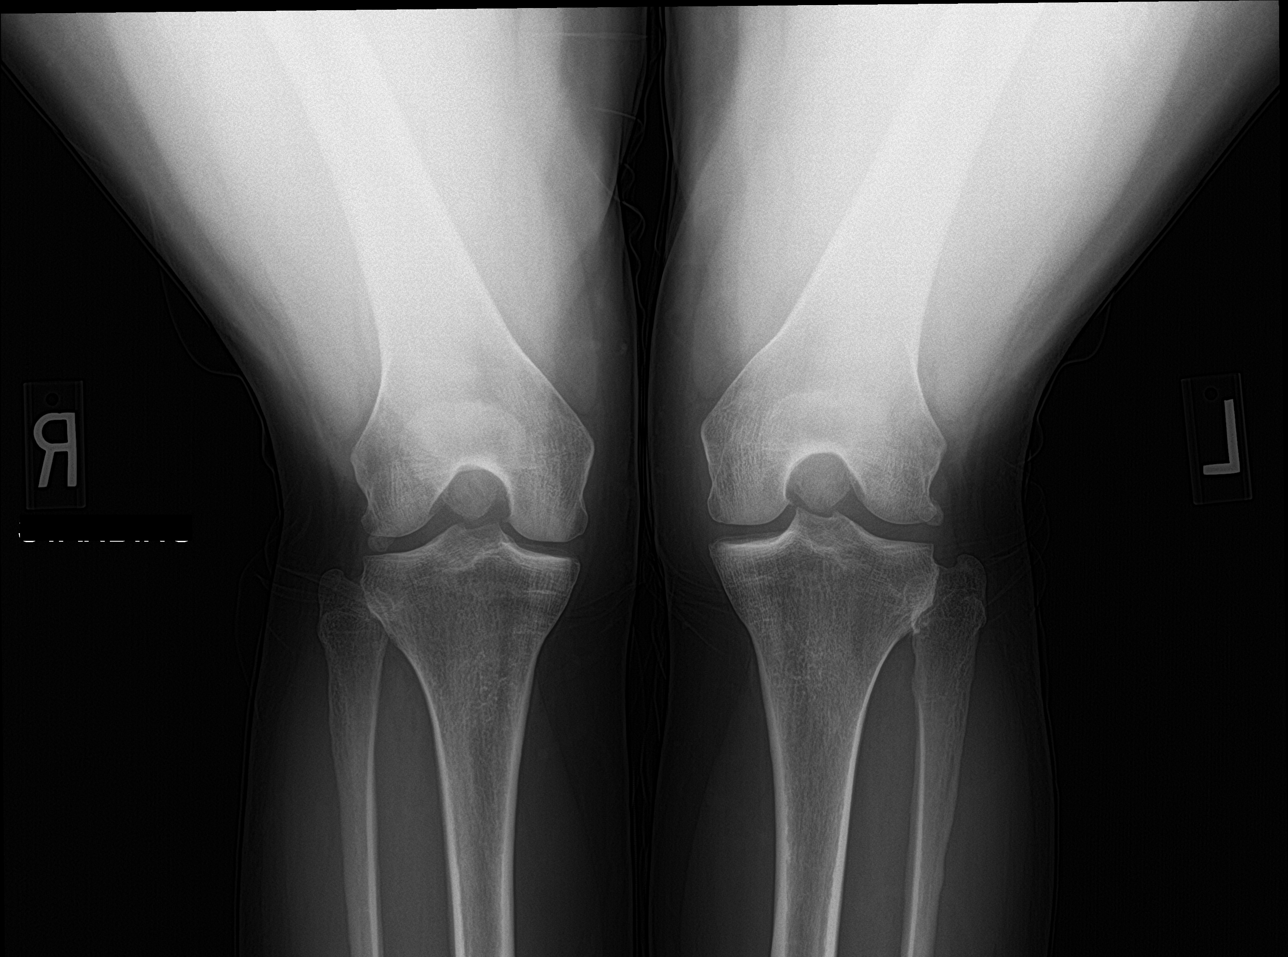

[knee lat]
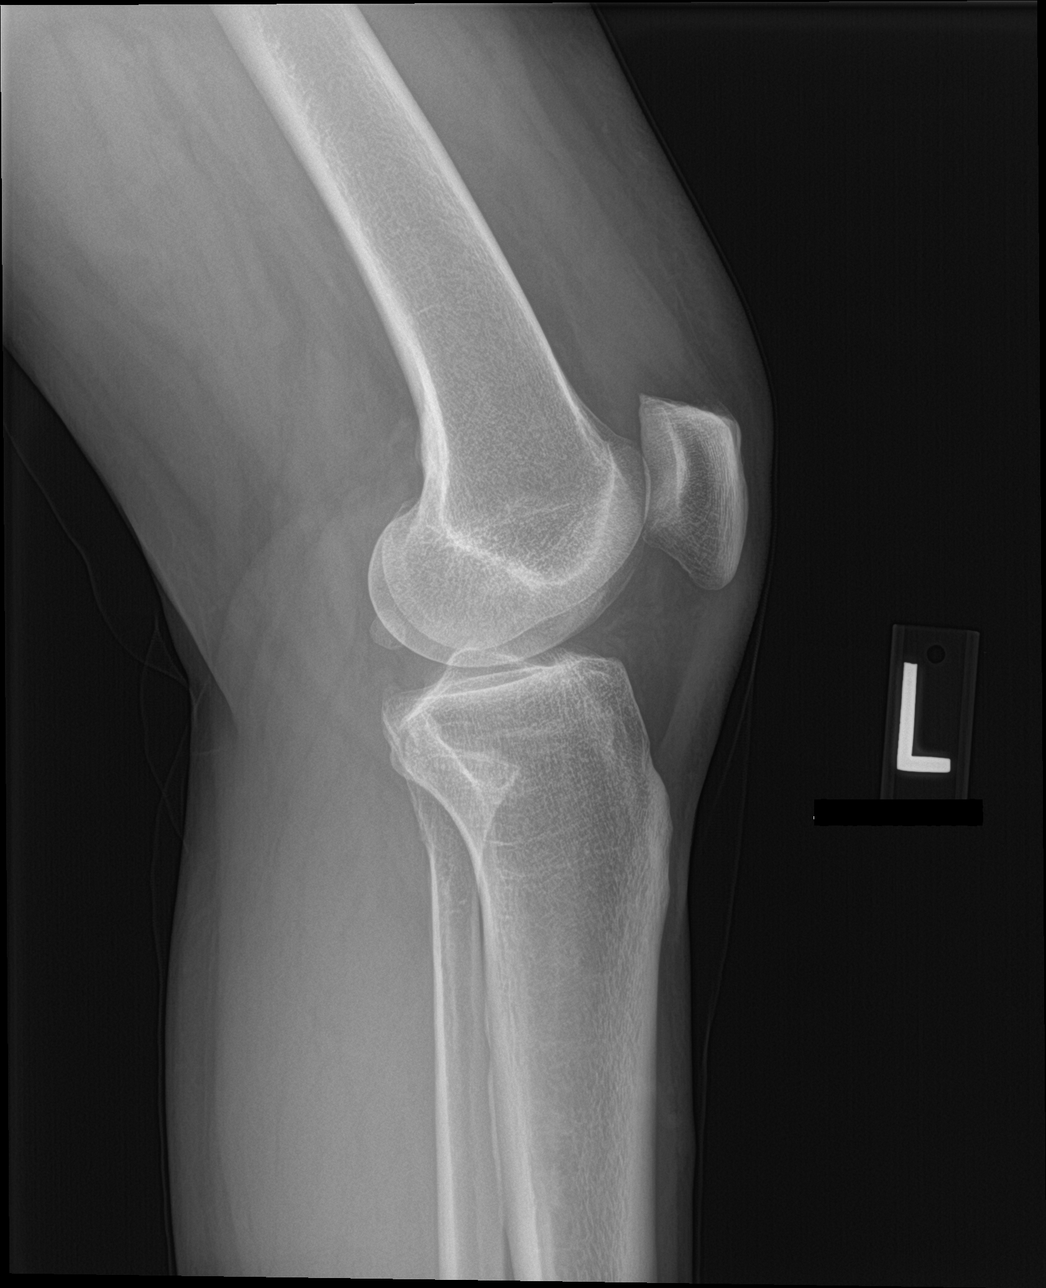

[knee sunrise]
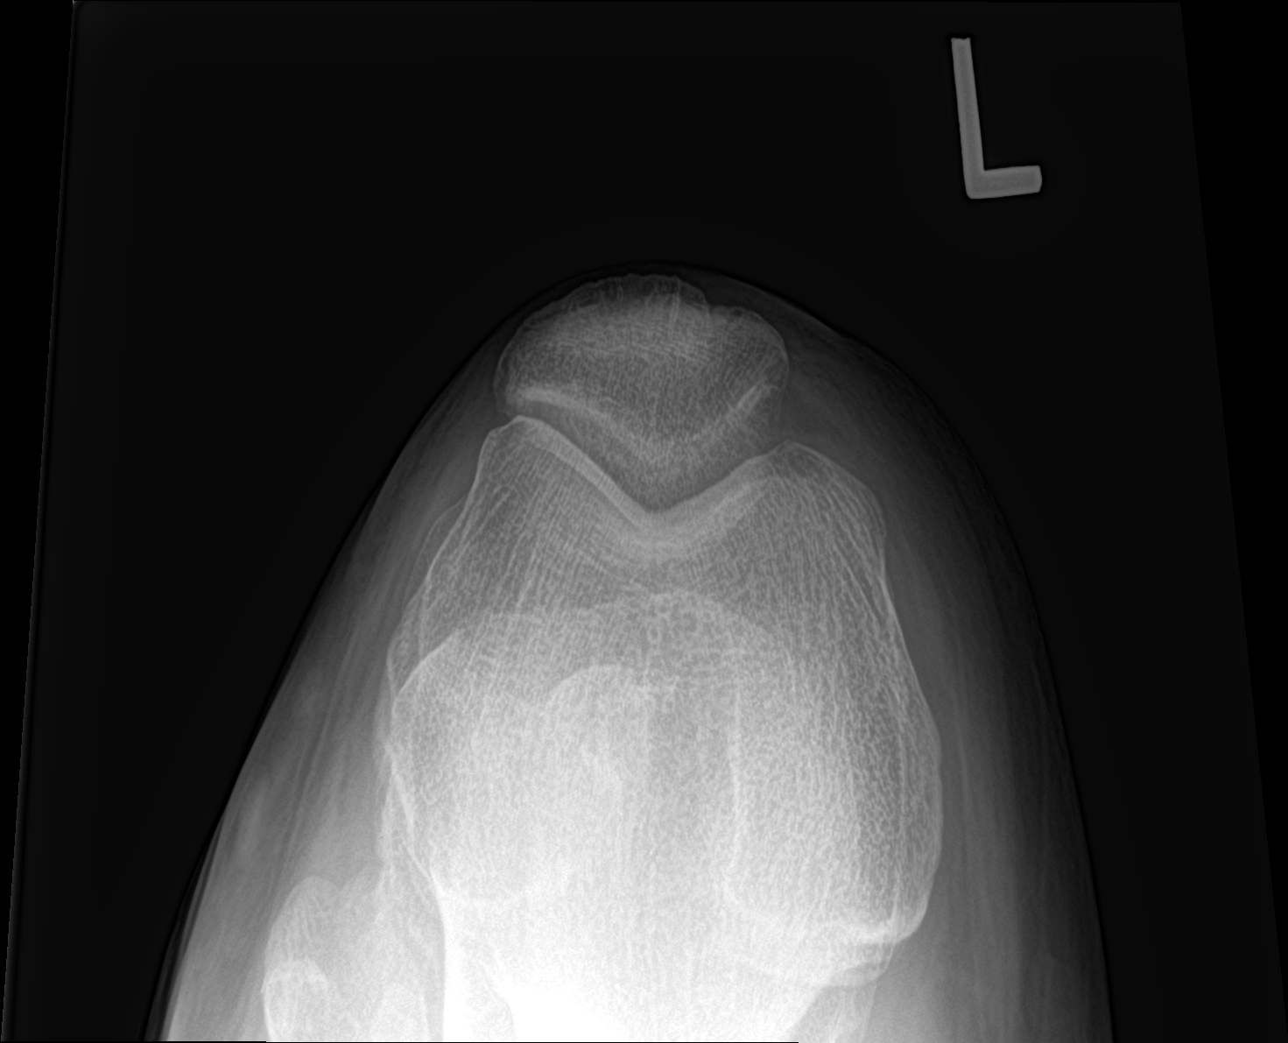

[knee ap bilat standing]
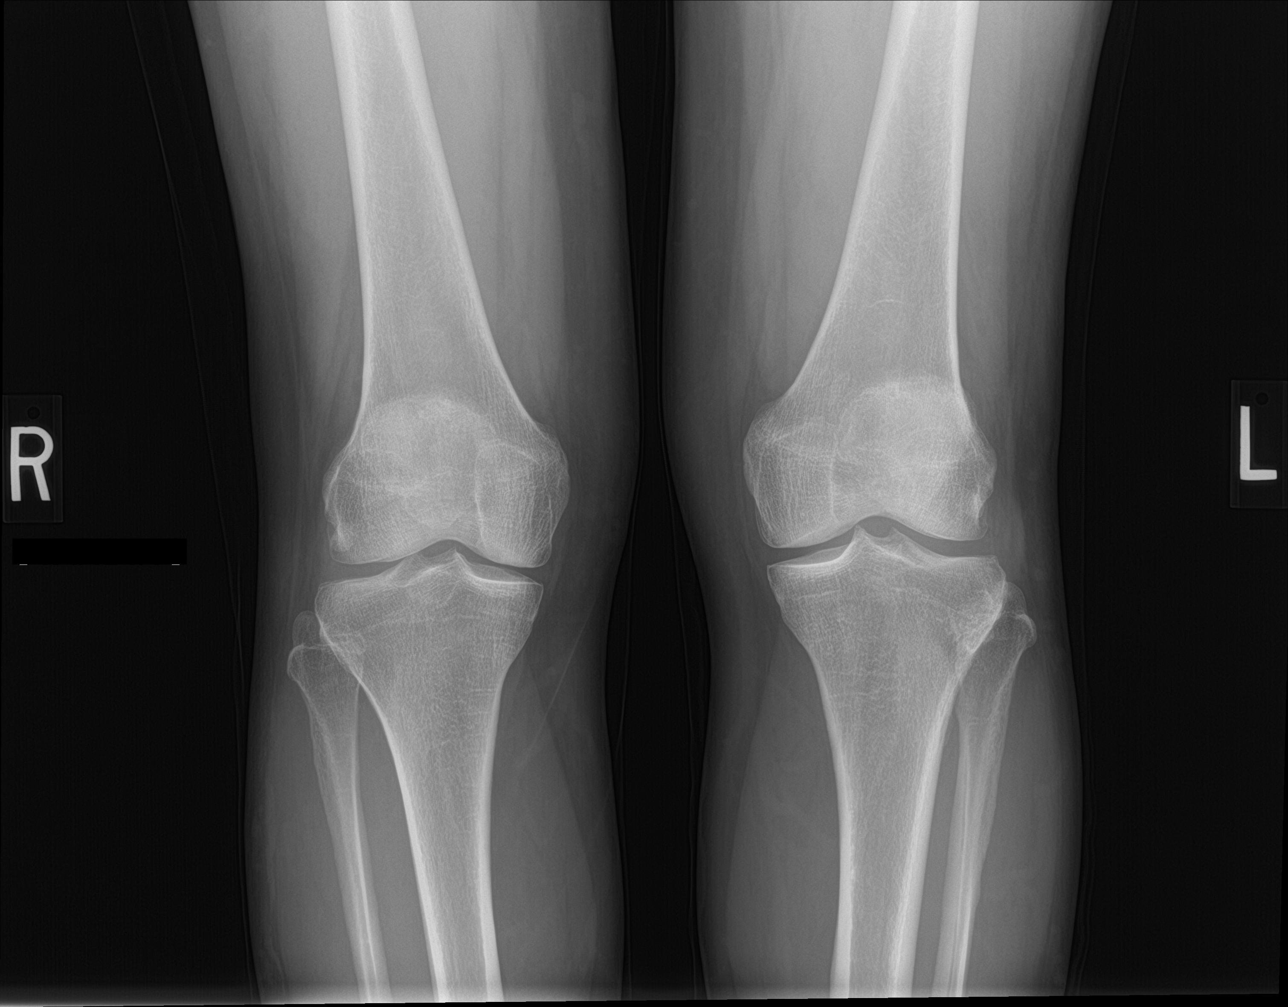

[4 of 4 positions shown; findings below may reference images not displayed]

FINDINGS: There are mild tricompartmental degenerative changes of both knees.
There is no acute displaced fracture or dislocation. There is a
small joint effusion on the left.
IMPRESSION: 1. No acute displaced fracture or dislocation.
2. Mild tricompartmental degenerative changes of both knees.
3. Small left joint effusion.

## 2020-07-23 MED ORDER — MELOXICAM 15 MG PO TABS: 15.0000 mg | ORAL_TABLET | Freq: Every day | ORAL | 0 refills | Status: DC

## 2020-07-23 NOTE — Progress Notes (Signed)
Acute Office Visit  Subjective:    Patient ID: Tina Pearson, female    DOB: August 06, 1980, 40 y.o.   MRN: [id number]  Chief Complaint  Patient presents with  . Knee Pain    HPI Patient is in today for left knee pain  Patient reports last Tuesday she noticed her left medial knee felt tight and was starting to swell. Initially, she did not notice any pain other than the tightness associated with swelling, but within a few days she started to have pain with activity. She states her pain started anteriorly and medially eventually with some posterior pain as well. At rest her pain is 0/10, but with any movement, walking, bending, flexion, etc she is at 8/10 pain. She does not recall any recent injuries/trauma/fall and has not been exercising recently. She reports some popping, but states she feels like all of her joints pop occasionally and she isn't sure if this is more than baseline or not. She denies any redness, rashes, bruising, radiation of pain, loss of function. States she does feel a little hesitant when walking, but denies any true instability.    KNEE PAIN Duration: days Involved knee: left Mechanism of injury: none Location:diffuse Onset: sudden Severity: moderate 0/10 at rest, with movement/flexion 8/10 Quality:  pressure-like, pulling and throbbing Frequency: occasional Radiation: no Aggravating factors: weight bearing, walking, running, stairs, bending and movement  Alleviating factors: rest  Status: worse Treatments attempted: rest, ice, heat, ibuprofen Relief with NSAIDs?:  no Weakness with weight bearing or walking: no Sensation of giving way: no Locking: no Popping: yes, some at baseline, unsure if related Bruising: no Swelling: yes Redness: no Paresthesias/decreased sensation: no Fevers: no    Past Medical History:  Diagnosis Date  . Hyperlipidemia   . Migraines   . Post partum thyroiditis    Dr. Morrison Old    Past Surgical History:  Procedure Laterality Date   . VEIN SURGERY Right 01/2015   [facility] and laser.   . WISDOM TOOTH EXTRACTION      Family History  Problem Relation Age of Onset  . Heart attack Father   . Hyperlipidemia Father   . Heart attack Paternal Grandfather   . Hyperlipidemia Mother   . Stroke Paternal Grandfather     Social History   Socioeconomic History  . Marital status: Married    Spouse name: Not on file  . Number of children: 1  . Years of education: Not on file  . Highest education level: Not on file  Occupational History  . Occupation: Chief Financial Officer    Tobacco Use  . Smoking status: Never Smoker  . Smokeless tobacco: Never Used  Substance and Sexual Activity  . Alcohol use: Not on file    Comment: rare  . Drug use: Not on file  . Sexual activity: Not on file    Comment: Occupational psychologist, runs 2 X week, eats healthy, married, no kids.  Other Topics Concern  . Not on file  Social History Narrative   Some exercise. Drinks caffeine daily.    Social Determinants of Health   Financial Resource Strain: Not on file  Food Insecurity: Not on file  Transportation Needs: Not on file  Physical Activity: Not on file  Stress: Not on file  Social Connections: Not on file  Intimate Partner Violence: Not on file    Outpatient Medications Prior to Visit  Medication Sig Dispense Refill  . atorvastatin (LIPITOR) 20 MG tablet TAKE 1 TABLET AT BEDTIME 90 tablet 3  .  EPINEPHrine 0.3 mg/0.3 mL IJ SOAJ injection Inject into the skin.    . frovatriptan (FROVA) 2.5 MG tablet TAKE 1 TABLET DAILY AS NEEDED FOR MIGRAINE. IF RECURS, MAY REPEAT AFTER 2 HOURS. MAX OF 3 TABLETS IN 24 HOURS. 27 tablet 9  . levonorgestrel-ethinyl estradiol (SEASONALE) 0.15-0.03 MG tablet TAKE 1 TABLET DAILY 273 tablet 2   No facility-administered medications prior to visit.    Allergies  Allergen Reactions  . Chlorpheniramine-Codeine Other (See Comments)    Headaches, dizziness, palpitations, "drunk feeling"  . Dextromethorphan  Other (See Comments)    Migraine headaches,   . Sumatriptan Nausea And Vomiting  . Yellow [place]et Venom [Bee Venom] Swelling and Rash    Review of Systems All review of systems negative except what is listed in the HPI     Objective:    Physical Exam Constitutional:      Appearance: Normal appearance.  Musculoskeletal:     Right knee: Normal.     Left knee: Swelling present. No erythema or ecchymosis. Tenderness present over the medial joint line.     Comments: Discomfort with medial McMurray and valgus stress  Skin:    General: Skin is warm and dry.     Capillary Refill: Capillary refill takes less than 2 seconds.     Findings: No bruising or rash.  Neurological:     General: No focal deficit present.     Mental Status: She is alert and oriented to person, place, and time.  Psychiatric:        Mood and Affect: Mood normal.        Behavior: Behavior normal.        Thought Content: Thought content normal.        Judgment: Judgment normal.     BP (!) 156/97   Pulse 95   SpO2 95%  Wt Readings from Last 3 Encounters:  05/01/20 130 lb (59 kg)  03/22/20 133 lb 4.8 oz (60.5 kg)  05/02/19 130 lb (59 kg)    Health Maintenance Due  Topic Date Due  . COVID-19 Vaccine (3 - Booster for Pfizer series) 03/10/2020    There are no preventive care reminders to display for this patient.   Lab Results  Component Value Date   TSH 1.36 05/01/2020   Lab Results  Component Value Date   WBC 5.9 05/01/2020   HGB 14.4 05/01/2020   HCT 41.7 05/01/2020   MCV 89.5 05/01/2020   PLT 240 05/01/2020   Lab Results  Component Value Date   NA 139 05/01/2020   K 3.8 05/01/2020   CO2 26 05/01/2020   GLUCOSE 87 05/01/2020   BUN 9 05/01/2020   CREATININE 0.86 05/01/2020   BILITOT 0.5 05/01/2020   ALKPHOS 66 07/30/2016   AST 14 05/01/2020   ALT 11 05/01/2020   PROT 7.0 05/01/2020   ALBUMIN 4.2 07/30/2016   CALCIUM 9.3 05/01/2020   Lab Results  Component Value Date   CHOL 163  05/01/2020   Lab Results  Component Value Date   HDL 40 (L) 05/01/2020   Lab Results  Component Value Date   LDLCALC 100 (H) 05/01/2020   Lab Results  Component Value Date   TRIG 126 05/01/2020   Lab Results  Component Value Date   CHOLHDL 4.1 05/01/2020   No results found for: HGBA1C     Assessment & Plan:   1. Pain and swelling of left knee  Given her pain level 8/10 with any activity, swelling continuing to  increase despite conservative measures at home, disomfort on exam with McMurray, and mild valgus stress discomfort will go ahead and start with x-rays, meloxicam, rest, ice, compression, elevation. Consider further imaging after x-ray results.  - meloxicam (MOBIC) 15 MG tablet; Take 1 tablet (15 mg total) by mouth daily.  Dispense: 30 tablet; Refill: 0 - DG Knee Complete 4 Views Left; Future - DG Knee 1-2 Views Right; Future   BP elevated this visit. Patient with history of white coat hypertension. Instructed her to recheck her BP at home over the next several days and reach out to PCP if BP remains >140/80.   Follow-up if symptoms worsen or fail to improve.   Clayborne Dana, NP

## 2020-07-24 NOTE — Progress Notes (Signed)
MyChart message sent: Tina Pearson, your x-ray showed a mild effusion (swelling like we saw on exam which I expected). However, there were some signs of arthritis. I discussed your case with Dr. Karie Schwalbe. Let's continue with 6 weeks of conservative therapy - Meloxicam, stretches, occasional ice, etc. If your symptoms worsen, or you start having knee instability, locking, or increased popping please let us know. Otherwise, call the office and schedule a follow-up with me in 6 weeks and we can do a knee injection if that is something you would be interested in. I hope you feel better soon!

## 2020-07-27 ENCOUNTER — Encounter (HOSPITAL_BASED_OUTPATIENT_CLINIC_OR_DEPARTMENT_OTHER): Payer: Self-pay | Admitting: Nurse Practitioner

## 2020-07-30 ENCOUNTER — Ambulatory Visit: Payer: BC Managed Care – PPO | Admitting: Sports Medicine

## 2020-07-30 ENCOUNTER — Other Ambulatory Visit: Payer: Self-pay

## 2020-07-30 ENCOUNTER — Ambulatory Visit (INDEPENDENT_AMBULATORY_CARE_PROVIDER_SITE_OTHER): Payer: BC Managed Care – PPO

## 2020-07-30 DIAGNOSIS — M1712 Unilateral primary osteoarthritis, left knee: Secondary | ICD-10-CM

## 2020-07-30 DIAGNOSIS — L600 Ingrowing nail: Secondary | ICD-10-CM

## 2020-07-30 DIAGNOSIS — M948X6 Other specified disorders of cartilage, lower leg: Secondary | ICD-10-CM | POA: Diagnosis not present

## 2020-07-30 DIAGNOSIS — S83207A Unspecified tear of unspecified meniscus, current injury, left knee, initial encounter: Secondary | ICD-10-CM | POA: Diagnosis not present

## 2020-07-30 DIAGNOSIS — L603 Nail dystrophy: Secondary | ICD-10-CM | POA: Insufficient documentation

## 2020-07-30 DIAGNOSIS — M25462 Effusion, left knee: Secondary | ICD-10-CM | POA: Diagnosis not present

## 2020-07-30 IMAGING — MR MR KNEE*L* W/O CM
7 series · 40 of 40 positions shown · non-contrast
Comparison: Left knee x-rays dated [DATE].

CLINICAL DATA: Medial knee pain and mechanical symptoms.

EXAM:
MRI OF THE LEFT KNEE WITHOUT CONTRAST
TECHNIQUE: Multiplanar, multisequence MR imaging of the knee was performed. No
intravenous contrast was administered.

[Series 3: T2 fat-sat · axial · 4.0mm · 0.53mm/px · z∈[-94,+76]mm · 6 of 35 slices shown (1 of 3)]
[im 1/35]
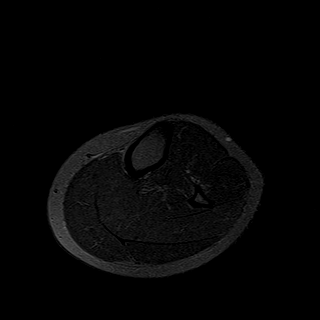
[im 7/35]
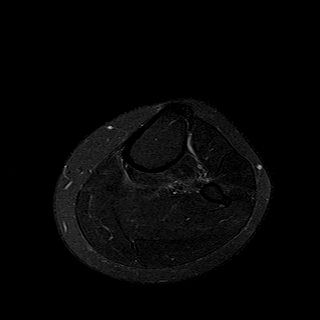
[im 14/35]
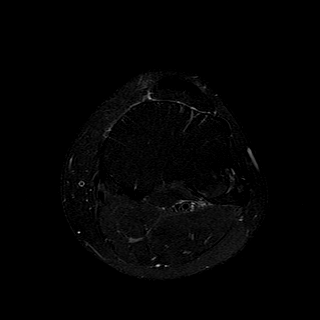
[im 21/35]
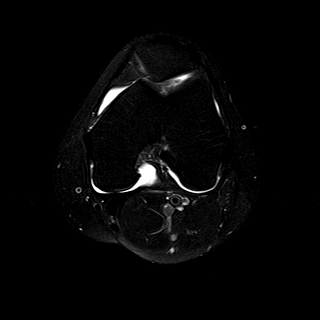
[im 28/35]
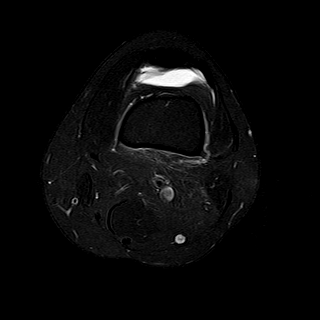
[im 35/35]
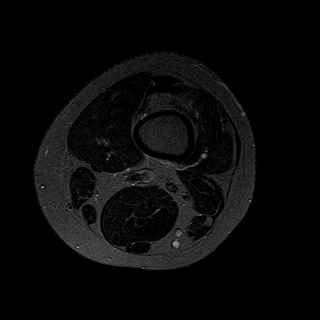

[Series 4: T1 · coronal · 4.0mm · 0.62mm/px · 6 of 28 slices shown]
[im 1/28]
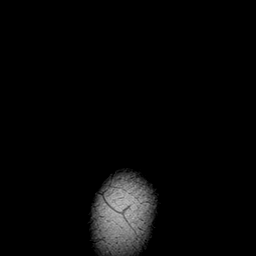
[im 6/28]
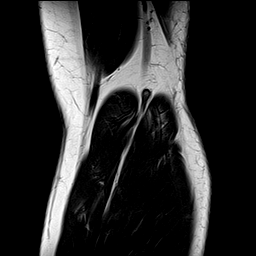
[im 11/28]
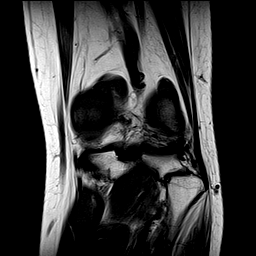
[im 17/28]
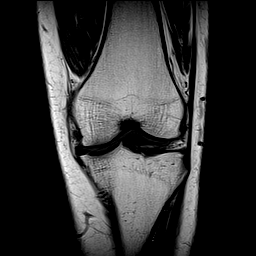
[im 22/28]
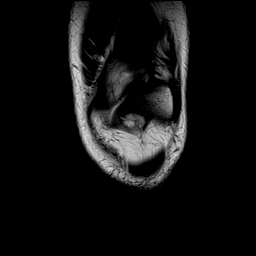
[im 28/28]
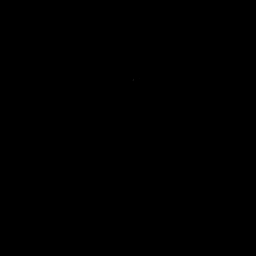

[Series 5: T2 fat-sat · coronal · 4.0mm · 0.62mm/px · 6 of 28 slices shown (2 of 3)]
[im 1/28]
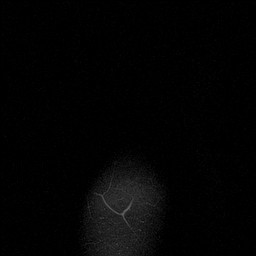
[im 6/28]
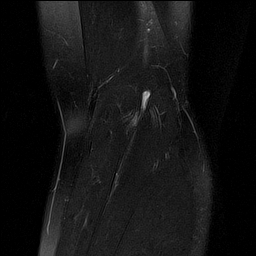
[im 11/28]
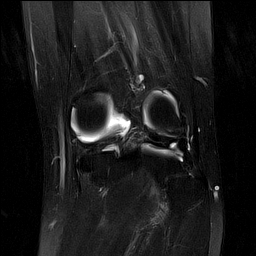
[im 17/28]
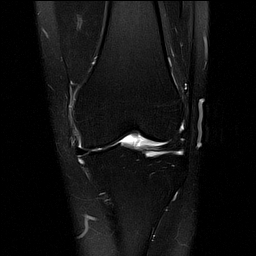
[im 22/28]
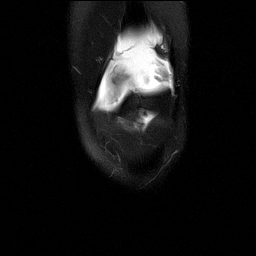
[im 28/28]
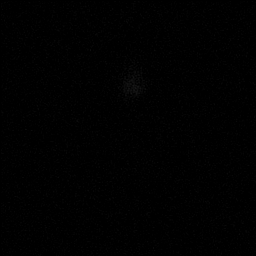

[Series 6: PD fat-sat · coronal · 4.0mm · 0.62mm/px · 6 of 28 slices shown (1 of 3)]
[im 1/28]
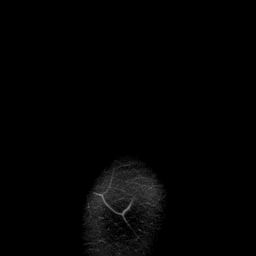
[im 6/28]
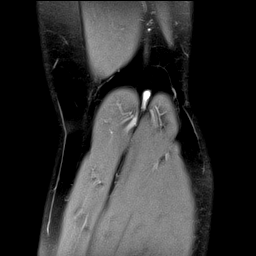
[im 11/28]
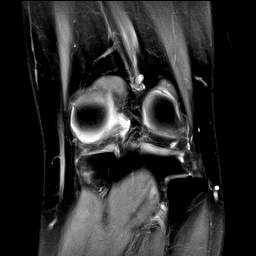
[im 17/28]
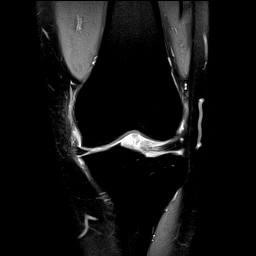
[im 22/28]
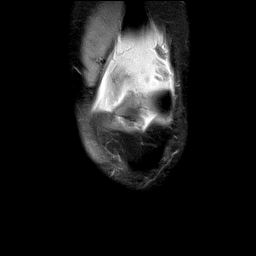
[im 28/28]
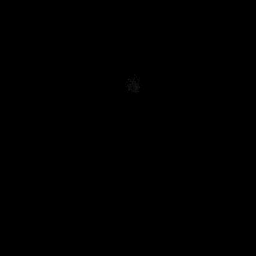

[Series 7: PD fat-sat · sagittal · 3.0mm · 0.62mm/px · 6 of 31 slices shown (2 of 3)]
[im 1/31]
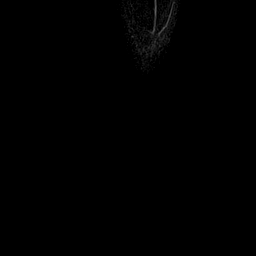
[im 7/31]
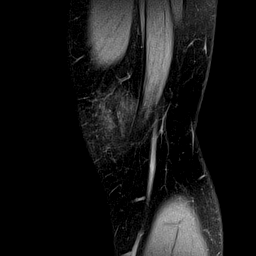
[im 13/31]
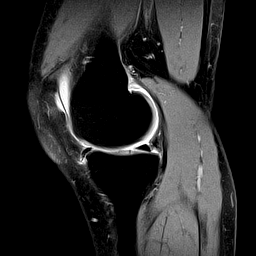
[im 19/31]
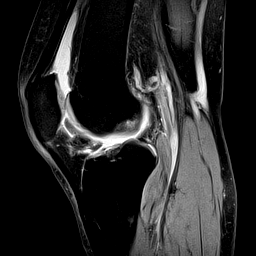
[im 25/31]
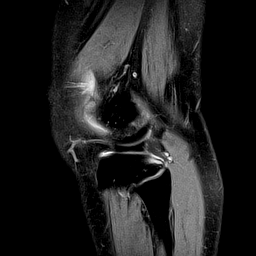
[im 31/31]
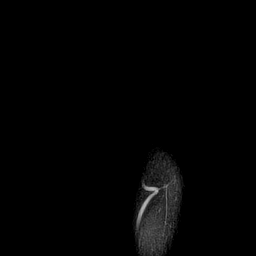

[Series 8: T2 fat-sat · sagittal · 3.0mm · 0.62mm/px · 6 of 31 slices shown (3 of 3)]
[im 1/31]
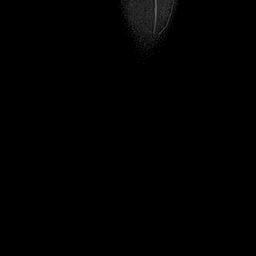
[im 7/31]
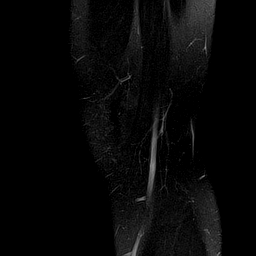
[im 13/31]
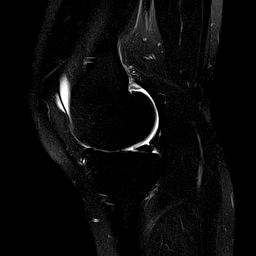
[im 19/31]
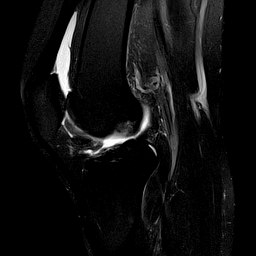
[im 25/31]
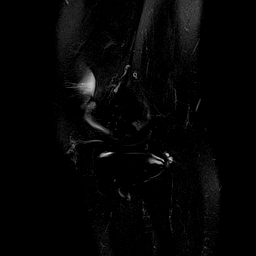
[im 31/31]
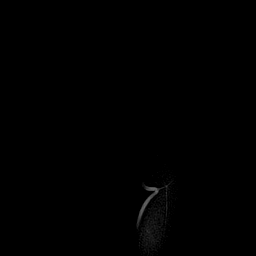

[Series 9: PD fat-sat · oblique · 2.0mm · 0.62mm/px · 4 of 19 slices shown (3 of 3)]
[im 1/19]
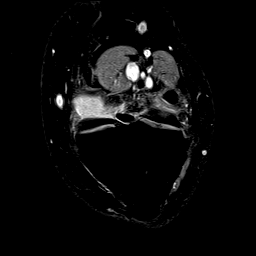
[im 7/19]
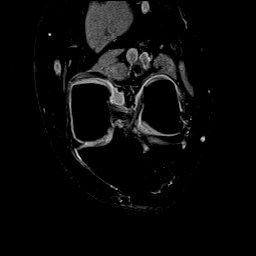
[im 13/19]
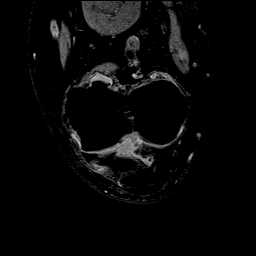
[im 19/19]
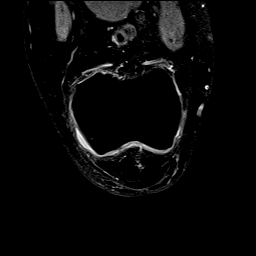

[40 of 40 positions shown; findings below may reference images not displayed]

FINDINGS: MENISCI

Medial meniscus:  Intact.

Lateral meniscus:  Intact.

LIGAMENTS

Cruciates:  Intact ACL and PCL.

Collaterals: Medial collateral ligament is intact. Lateral
collateral ligament complex is intact.

CARTILAGE

Patellofemoral: Mild partial-thickness cartilage loss over the
patella. Near full-thickness cartilage loss over the trochlear
groove.

Medial: Partial and near full-thickness cartilage loss over the
central weight-bearing medial femoral condyle.

Lateral: Mild partial-thickness cartilage loss over the mesial
aspect of the lateral femoral condyle and lateral tibial plateau.

Joint:  Small joint effusion.  Normal Hoffa's fat.

Popliteal Fossa:  No Baker cyst. Intact popliteus tendon.

Extensor Mechanism: Intact quadriceps tendon and patellar tendon.
Intact medial and lateral patellar retinaculum. Intact MPFL.

Bones: No focal marrow signal abnormality. No fracture or
dislocation.

Other: None.
IMPRESSION: 1. No meniscal or ligamentous injury.
2. Mild tricompartmental osteoarthritis.
3. Small joint effusion.

## 2020-07-30 MED ORDER — TRAMADOL HCL 50 MG PO TABS
50.0000 mg | ORAL_TABLET | Freq: Three times a day (TID) | ORAL | 0 refills | Status: DC | PRN
Start: 2020-07-30 — End: 2020-08-02

## 2020-07-30 NOTE — Assessment & Plan Note (Signed)
Very pleasant 40 year old female, she had rapid onset pain in her left knee with swelling, medial joint line pain as well as mechanical symptoms. On exam she has an effusion, severe pain with palpation of the medial joint line. She has a positive McMurray sign and inability to fully extend the knee. X-rays showed osteoarthritis. Due to mechanical symptoms recommend proceed with MRI, she also like to do this before we proceed with intervention though I think an injection is in her future. Return to see me to go over MRI results.

## 2020-07-30 NOTE — Assessment & Plan Note (Signed)
Ingrown medial great nail plate. She has had this removed once, she can return to see me in a future visit for left medial great nail plate excision with phenol matricectomy.

## 2020-07-30 NOTE — Progress Notes (Signed)
    Procedures performed today:    None.  Independent interpretation of notes and tests performed by another provider:   None.  Brief History, Exam, Impression, and Recommendations:    Primary osteoarthritis of left knee Very pleasant 40 year old female, she had rapid onset pain in her left knee with swelling, medial joint line pain as well as mechanical symptoms. On exam she has an effusion, severe pain with palpation of the medial joint line. She has a positive McMurray sign and inability to fully extend the knee. X-rays showed osteoarthritis. Due to mechanical symptoms recommend proceed with MRI, she also like to do this before we proceed with intervention though I think an injection is in her future. Return to see me to go over MRI results.  Ingrown toenail of left foot Ingrown medial great nail plate. She has had this removed once, she can return to see me in a future visit for left medial great nail plate excision with phenol matricectomy.    ___________________________________________ Ihor Austin. Benjamin Stain, M.D., ABFM., CAQSM. Primary Care and Sports Medicine  [facility]  Adjunct Instructor of Family Medicine  [employer]

## 2020-08-01 ENCOUNTER — Other Ambulatory Visit: Payer: Self-pay

## 2020-08-01 ENCOUNTER — Ambulatory Visit (INDEPENDENT_AMBULATORY_CARE_PROVIDER_SITE_OTHER): Payer: BC Managed Care – PPO

## 2020-08-01 ENCOUNTER — Ambulatory Visit: Payer: BC Managed Care – PPO | Admitting: Sports Medicine

## 2020-08-01 DIAGNOSIS — M1712 Unilateral primary osteoarthritis, left knee: Secondary | ICD-10-CM

## 2020-08-01 NOTE — Assessment & Plan Note (Signed)
This pleasant 40 year old female returns, she has rapid onset knee pain, we ultimately obtained an MRI for concerns for a meniscal tear, MRI only showed osteoarthritis. She has failed oral medications, we are proceeding with injection today, we discussed additional treatments including viscosupplementation. Return to see me in a month.

## 2020-08-01 NOTE — Progress Notes (Signed)
    Procedures performed today:    Procedure: Real-time Ultrasound Guided injection of the left knee Device: Samsung HS60  Verbal informed consent obtained.  Time-out conducted.  Noted no overlying erythema, induration, or other signs of local infection.  Skin prepped in a sterile fashion.  Local anesthesia: Topical Ethyl chloride.  With sterile technique and under real time ultrasound guidance:  Noted trace effusion, 1 cc Kenalog 40, 2 cc lidocaine, 2 cc bupivacaine injected easily Completed without difficulty  Advised to call if fevers/chills, erythema, induration, drainage, or persistent bleeding.  Images permanently stored and available for review in PACS.  Impression: Technically successful ultrasound guided injection.  Independent interpretation of notes and tests performed by another provider:   None.  Brief History, Exam, Impression, and Recommendations:    Primary osteoarthritis of left knee This pleasant 40 year old female returns, she has rapid onset knee pain, we ultimately obtained an MRI for concerns for a meniscal tear, MRI only showed osteoarthritis. She has failed oral medications, we are proceeding with injection today, we discussed additional treatments including viscosupplementation. Return to see me in a month.    ___________________________________________ Ihor Austin. Benjamin Stain, M.D., ABFM., CAQSM. Primary Care and Sports Medicine [place] [facility]  Adjunct Instructor of Family Medicine  [employer]

## 2020-08-02 ENCOUNTER — Ambulatory Visit: Payer: BC Managed Care – PPO | Admitting: Sports Medicine

## 2020-08-02 DIAGNOSIS — L600 Ingrowing nail: Secondary | ICD-10-CM

## 2020-08-02 MED ORDER — HYDROCODONE-ACETAMINOPHEN 10-325 MG PO TABS: 1.0000 | ORAL_TABLET | Freq: Three times a day (TID) | ORAL | 0 refills | Status: DC | PRN

## 2020-08-02 NOTE — Patient Instructions (Signed)
Fingernail or Toenail Removal, Adult, Care After This sheet gives you information about how to care for yourself after your procedure. Your health care provider may also give you more specific instructions. If you have problems or questions, contact your health care provider. What can I expect after the procedure? After the procedure, it is common to have:  Pain.  Redness.  Swelling.  Soreness. Follow these instructions at home: Medicines  Take over-the-counter and prescription medicines only as told by your health care provider.  If you were prescribed an antibiotic medicine, take or apply it as told by your health care provider. Do not stop using the antibiotic even if you start to feel better. Wound care  Follow instructions from your health care provider about how to take care of your wound. Make sure you: ? Wash your hands with soap and water for at least 20 seconds before and after you change your bandage (dressing). If soap and water are not available, use hand sanitizer. ? Change your dressing as told by your health care provider. ? Keep your dressing dry until your health care provider says it can be removed.  Check your wound every day for signs of infection. Check for: ? More redness, swelling, or pain. ? More fluid or blood. ? Warmth. ? Pus or a bad smell. If you have a splint:  Wear the splint as told by your health care provider. Remove it only as told by your health care provider.  Loosen the splint if your fingers tingle, become numb, or turn cold and blue.  Keep the splint clean.  If the splint is not waterproof: ? Do not let it get wet. ? Cover it with a watertight covering when you take a bath or a shower.   Managing pain, stiffness, and swelling  Move your fingers or toes often to reduce stiffness and swelling.  Raise (elevate) the injured area above the level of your heart while you are sitting or lying down. You may need to keep your hand or foot  raised or supported on a pillow for 24 hours or as told by your health care provider. General instructions  If you were given a shoe to wear, wear it as told by your health care provider.  Keep all follow-up visits as told by your health care provider. This is important. Contact a health care provider if:  You have more redness, swelling, or pain around your wound.  You have more fluid or blood coming from your wound.  You have pus or a bad smell coming from your wound.  Your wound feels warm to the touch.  You have a fever. Get help right away if:  Your finger or toe looks pale, blue, or black.  You are not able to move your finger or toe. Summary  After the procedure, it is common to have pain and swelling.  Keep the hand or foot raised (elevated) or supported on a pillow as told by your health care provider.  Take over-the-counter and prescription medicines only as told by your health care provider.  Check your wound every day for signs of infection. This information is not intended to replace advice given to you by your health care provider. Make sure you discuss any questions you have with your health care provider. Document Revised: 12/13/2018 Document Reviewed: 12/13/2018 Elsevier Patient Education  2021 Elsevier Inc.  

## 2020-08-02 NOTE — Progress Notes (Signed)
    Procedures performed today:    Procedure:  Removal of left medial and lateral nail plate of the great toenail with phenol matricectomy of the nailbed and matrix. Risks, benefits, alternatives explained to patient. Consent obtained. Time out conducted. Noted no overlying induration or erythema at site of injection. Toe cleaned with alcohol, then a total of 10 cc lidocaine 2% infiltrated at adjacent webspaces at the location of the bifurcation of the common digital nerve to proper digital nerves.  Some lidocaine also infiltrated at hyponychium and under nail bed.  Adequate anesthesia ensured. Toe prepped and draped in a sterile fashion. Nail elevator used to separate nail plate from nail bed. Clippers used to cut toenail in a longitudinal fashion to proximal nail fold and matrix. Hemostat then used to separate nail fragment from surrounding structures, this was repeated for the medial and lateral aspects of the great nail plate. Nail bed and matrix treated. Minor bleeding controlled with pressure and phenol. Antibiotic ointment applied. Toe dressed. Advised to return if increased redness, swelling, drainage, fevers, or chills.  Independent interpretation of notes and tests performed by another provider:   None.  Brief History, Exam, Impression, and Recommendations:    Ingrown toenail of left foot Removal of left great medial and lateral nail plates with phenol matricectomy. High-dose hydrocodone for postoperative pain. Return to see me in 2 weeks for wound check.    ___________________________________________ Ihor Austin. Benjamin Stain, M.D., ABFM., CAQSM. Primary Care and Sports Medicine [place] [facility]  Adjunct Instructor of Family Medicine  [employer]

## 2020-08-02 NOTE — Assessment & Plan Note (Signed)
Removal of left great medial and lateral nail plates with phenol matricectomy. High-dose hydrocodone for postoperative pain. Return to see me in 2 weeks for wound check.

## 2020-08-16 ENCOUNTER — Ambulatory Visit: Payer: BC Managed Care – PPO | Admitting: Sports Medicine

## 2020-08-16 ENCOUNTER — Other Ambulatory Visit: Payer: Self-pay

## 2020-08-16 ENCOUNTER — Encounter: Payer: Self-pay | Admitting: Sports Medicine

## 2020-08-16 DIAGNOSIS — L600 Ingrowing nail: Secondary | ICD-10-CM | POA: Diagnosis not present

## 2020-08-16 NOTE — Assessment & Plan Note (Signed)
Tina Pearson returns, she is a very pleasant 40 year old female, she is approximately 2 weeks post removal of the left great medial and lateral nail plates with phenol matricectomy, pain is significantly better, no tenderness to palpation, no surrounding erythema, no drainage or purulence. Everything is great, return as needed.

## 2020-08-16 NOTE — Progress Notes (Signed)
    Procedures performed today:    None.  Independent interpretation of notes and tests performed by another provider:   None.  Brief History, Exam, Impression, and Recommendations:    Ingrown toenail of left foot Tina Pearson returns, she is a very pleasant 40 year old female, she is approximately 2 weeks post removal of the left great medial and lateral nail plates with phenol matricectomy, pain is significantly better, no tenderness to palpation, no surrounding erythema, no drainage or purulence. Everything is great, return as needed.    ___________________________________________ Ihor Austin. Benjamin Stain, M.D., ABFM., CAQSM. Primary Care and Sports Medicine [place] [facility]  Adjunct Instructor of Family Medicine  [employer]

## 2020-08-29 ENCOUNTER — Ambulatory Visit: Payer: BC Managed Care – PPO | Admitting: Sports Medicine

## 2020-09-03 DIAGNOSIS — Z1231 Encounter for screening mammogram for malignant neoplasm of breast: Secondary | ICD-10-CM | POA: Diagnosis not present

## 2020-09-03 HISTORY — PX: OTHER SURGICAL HISTORY: SHX169

## 2020-09-03 LAB — HM MAMMOGRAPHY

## 2020-09-15 ENCOUNTER — Other Ambulatory Visit: Payer: Self-pay | Admitting: Family Medicine

## 2020-09-20 ENCOUNTER — Encounter: Payer: Self-pay | Admitting: Family Medicine

## 2020-09-20 DIAGNOSIS — O905 Postpartum thyroiditis: Secondary | ICD-10-CM

## 2020-09-21 DIAGNOSIS — O905 Postpartum thyroiditis: Secondary | ICD-10-CM | POA: Insufficient documentation

## 2020-09-24 DIAGNOSIS — O905 Postpartum thyroiditis: Secondary | ICD-10-CM | POA: Diagnosis not present

## 2020-09-25 LAB — TSH+FREE T4: TSH W/REFLEX TO FT4: 3.45 mIU/L

## 2020-10-09 ENCOUNTER — Encounter: Payer: Self-pay | Admitting: Family Medicine

## 2020-10-15 DIAGNOSIS — E063 Autoimmune thyroiditis: Secondary | ICD-10-CM | POA: Diagnosis not present

## 2020-10-15 DIAGNOSIS — E041 Nontoxic single thyroid nodule: Secondary | ICD-10-CM | POA: Diagnosis not present

## 2020-12-09 ENCOUNTER — Other Ambulatory Visit: Payer: Self-pay | Admitting: Family Medicine

## 2020-12-09 DIAGNOSIS — N920 Excessive and frequent menstruation with regular cycle: Secondary | ICD-10-CM

## 2021-01-15 DIAGNOSIS — E041 Nontoxic single thyroid nodule: Secondary | ICD-10-CM | POA: Diagnosis not present

## 2021-01-15 DIAGNOSIS — E063 Autoimmune thyroiditis: Secondary | ICD-10-CM | POA: Diagnosis not present

## 2021-01-22 DIAGNOSIS — J208 Acute bronchitis due to other specified organisms: Secondary | ICD-10-CM | POA: Diagnosis not present

## 2021-01-22 DIAGNOSIS — B9689 Other specified bacterial agents as the cause of diseases classified elsewhere: Secondary | ICD-10-CM | POA: Diagnosis not present

## 2021-01-22 DIAGNOSIS — Z20822 Contact with and (suspected) exposure to covid-19: Secondary | ICD-10-CM | POA: Diagnosis not present

## 2021-05-08 ENCOUNTER — Encounter: Payer: BC Managed Care – PPO | Admitting: Family Medicine

## 2021-05-22 ENCOUNTER — Encounter: Payer: Self-pay | Admitting: Family Medicine

## 2021-05-22 ENCOUNTER — Other Ambulatory Visit: Payer: Self-pay

## 2021-05-22 ENCOUNTER — Ambulatory Visit (INDEPENDENT_AMBULATORY_CARE_PROVIDER_SITE_OTHER): Payer: BC Managed Care – PPO | Admitting: Family Medicine

## 2021-05-22 VITALS — BP 162/102 | HR 90 | Resp 16 | Ht 63.0 in | Wt 137.0 lb

## 2021-05-22 DIAGNOSIS — Z0000 Encounter for general adult medical examination without abnormal findings: Secondary | ICD-10-CM

## 2021-05-22 LAB — COMPLETE METABOLIC PANEL WITH GFR
AG Ratio: 1.5 (calc) (ref 1.0–2.5)
ALT: 9 U/L (ref 6–29)
AST: 11 U/L (ref 10–30)
Albumin: 4.4 g/dL (ref 3.6–5.1)
Alkaline phosphatase (APISO): 94 U/L (ref 31–125)
BUN: 10 mg/dL (ref 7–25)
CO2: 30 mmol/L (ref 20–32)
Calcium: 9.7 mg/dL (ref 8.6–10.2)
Chloride: 103 mmol/L (ref 98–110)
Creat: 0.94 mg/dL (ref 0.50–0.99)
Globulin: 2.9 g/dL (calc) (ref 1.9–3.7)
Glucose, Bld: 85 mg/dL (ref 65–99)
Potassium: 4.3 mmol/L (ref 3.5–5.3)
Sodium: 138 mmol/L (ref 135–146)
Total Bilirubin: 0.5 mg/dL (ref 0.2–1.2)
Total Protein: 7.3 g/dL (ref 6.1–8.1)
eGFR: 79 mL/min/{1.73_m2} (ref >=60)

## 2021-05-22 LAB — CBC
HCT: 45.2 % — ABNORMAL HIGH (ref 35.0–45.0)
Hemoglobin: 14.9 g/dL (ref 11.7–15.5)
MCH: 29.3 pg (ref 27.0–33.0)
MCHC: 33 g/dL (ref 32.0–36.0)
MCV: 89 fL (ref 80.0–100.0)
MPV: 12.8 fL — ABNORMAL HIGH (ref 7.5–12.5)
Platelets: 248 10*3/uL (ref 140–400)
RBC: 5.08 10*6/uL (ref 3.80–5.10)
RDW: 11.9 % (ref 11.0–15.0)
WBC: 7.3 10*3/uL (ref 3.8–10.8)

## 2021-05-22 LAB — LIPID PANEL W/REFLEX DIRECT LDL
Cholesterol: 174 mg/dL (ref <200)
HDL: 40 mg/dL — ABNORMAL LOW (ref >=50)
LDL Cholesterol (Calc): 110 mg/dL (calc) — ABNORMAL HIGH
Non-HDL Cholesterol (Calc): 134 mg/dL (calc) — ABNORMAL HIGH (ref <130)
Total CHOL/HDL Ratio: 4.4 (calc) (ref <5.0)
Triglycerides: 125 mg/dL (ref <150)

## 2021-05-22 LAB — TSH: TSH: 1.96 mIU/L

## 2021-05-22 NOTE — Progress Notes (Signed)
HI Tina Pearson,  Your cholesterol looks OK.  LDL up a little from last year. Encourage you to work on healthy diet and regular exercise. Metabolic panel and blood count are normal. Thyroid looks good.

## 2021-05-22 NOTE — Progress Notes (Signed)
Subjective:     Tina Pearson is a 41 y.o. female and is here for a comprehensive physical exam. The patient reports problems - BP has been runnig in the 130s and 140s at home. Often higher in the AM.  .  Social History   Socioeconomic History   Marital status: Married    Spouse name: Not on file   Number of children: 2   Years of education: Not on file   Highest education level: Not on file  Occupational History   Occupation: Marketing    Tobacco Use   Smoking status: Never   Smokeless tobacco: Never  Substance and Sexual Activity   Alcohol use: Not Currently   Drug use: Never   Sexual activity: Not on file    Comment: Occupational psychologist, runs 2 X week, eats healthy, married, no kids.  Other Topics Concern   Not on file  Social History Narrative   Exercise 2 days a week of kickboxing. Drinks caffeine daily.    Social Determinants of Health   Financial Resource Strain: Not on file  Food Insecurity: Not on file  Transportation Needs: Not on file  Physical Activity: Not on file  Stress: Not on file  Social Connections: Not on file  Intimate Partner Violence: Not on file   Health Maintenance  Topic Date Due   Hepatitis C Screening  Never done   COVID-19 Vaccine (4 - Booster for Pfizer series) 06/07/2021 (Originally 04/08/2020)   MAMMOGRAM  09/04/2022   PAP SMEAR-Modifier  05/01/2025   TETANUS/TDAP  01/10/2027   INFLUENZA VACCINE  Completed   HIV Screening  Completed   Pneumococcal Vaccine 23-65 Years old  Aged Out   HPV VACCINES  Aged Out    The following portions of the patient's history were reviewed and updated as appropriate: allergies, current medications, past family history, past medical history, past social history, past surgical history, and problem list.  Review of Systems Pertinent items are noted in HPI.   Objective:    BP (!) 162/102 (BP Location: Left Arm)    Pulse 90    Resp 16    Ht 5\' 3"  (1.6 m)    Wt 137 lb (62.1 kg)    LMP 04/09/2021    SpO2  99%    BMI 24.27 kg/m  General appearance: alert, cooperative, and appears stated age Head: Normocephalic, without obvious abnormality, atraumatic Eyes:  conj clear, EOMI, PEERLA Ears: normal TM's and external ear canals both ears Nose: Nares normal. Septum midline. Mucosa normal. No drainage or sinus tenderness. Throat: lips, mucosa, and tongue normal; teeth and gums normal Neck: no adenopathy and thyroid: enlarged and nodular Back: symmetric, no curvature. ROM normal. No CVA tenderness. Lungs: clear to auscultation bilaterally Breasts: normal appearance, no masses or tenderness Heart: regular rate and rhythm, S1, S2 normal, no murmur, click, rub or gallop Abdomen: soft, non-tender; bowel sounds normal; no masses,  no organomegaly Extremities: extremities normal, atraumatic, no cyanosis or edema Pulses: 2+ and symmetric Skin: Skin color, texture, turgor normal. No rashes or lesions Lymph nodes: Cervical, supraclavicular, and axillary nodes normal. Neurologic: Grossly normal    Assessment:    Healthy female exam.      Plan:     See After Visit Summary for Counseling Recommendations  Keep up a regular exercise program and make sure you are eating a healthy diet Try to eat 4 servings of dairy a day, or if you are lactose intolerant take a calcium with vitamin D daily.  Your vaccines are up to date.

## 2021-06-05 ENCOUNTER — Other Ambulatory Visit: Payer: Self-pay | Admitting: Family Medicine

## 2021-07-12 ENCOUNTER — Encounter: Payer: Self-pay | Admitting: Family Medicine

## 2021-07-12 ENCOUNTER — Other Ambulatory Visit: Payer: Self-pay

## 2021-07-12 ENCOUNTER — Ambulatory Visit: Payer: BC Managed Care – PPO | Admitting: Family Medicine

## 2021-07-12 DIAGNOSIS — R030 Elevated blood-pressure reading, without diagnosis of hypertension: Secondary | ICD-10-CM | POA: Insufficient documentation

## 2021-07-12 DIAGNOSIS — G43909 Migraine, unspecified, not intractable, without status migrainosus: Secondary | ICD-10-CM | POA: Diagnosis not present

## 2021-07-12 MED ORDER — KETOROLAC TROMETHAMINE 30 MG/ML IJ SOLN
30.0000 mg | Freq: Once | INTRAMUSCULAR | Status: AC
  Administered 2021-07-12: 30 mg via INTRAMUSCULAR

## 2021-07-12 MED ORDER — PROMETHAZINE HCL 25 MG/ML IJ SOLN
25.0000 mg | Freq: Once | INTRAMUSCULAR | Status: AC
  Administered 2021-07-12: 25 mg via INTRAMUSCULAR

## 2021-07-12 MED ORDER — ONDANSETRON 4 MG PO TBDP: 4.0000 mg | ORAL_TABLET | Freq: Three times a day (TID) | ORAL | 0 refills | Status: DC | PRN

## 2021-07-12 MED ORDER — DEXAMETHASONE SODIUM PHOSPHATE 10 MG/ML IJ SOLN
10.0000 mg | Freq: Once | INTRAMUSCULAR | Status: AC
  Administered 2021-07-12: 10 mg via INTRAMUSCULAR

## 2021-07-12 NOTE — Patient Instructions (Signed)
You may use zofran if needed for any continued nausea.   ?Let us know if migraine is not resolving.   ?F/u with Dr. Linford Arnold for your blood pressure as well.  ?

## 2021-07-12 NOTE — Progress Notes (Signed)
?Tina Pearson - 41 y.o. female MRN [id number]  Date of birth: 1981/04/26 ? ?Subjective ?Chief Complaint  ?Patient presents with  ? Acute Visit  ? ? ?HPI ?Tina Pearson is a 41 y.o. female here today with complaint of acute migraine.  She has history of migraines and Frova typically works well to abort this.  She has tried this along with motrin and tylenol without relief.  Symptoms are similar to previous migraines.  She denies any neurological changes.  She did recently discontinue her OCP due to concern that this was contributing to her blood pressure.  Her grandfather also recently passed away and she has had some stress related to this.   ? ?ROS:  A comprehensive ROS was completed and negative except as noted per HPI ? ?Allergies  ?Allergen Reactions  ? Chlorpheniramine-Codeine Other (See Comments)  ?  Headaches, dizziness, palpitations, "drunk feeling"  ? Dextromethorphan Other (See Comments)  ?  Migraine headaches,   ? Sumatriptan Nausea And Vomiting  ? Yellow Jacket Venom [Bee Venom] Swelling and Rash  ? ? ?Past Medical History:  ?Diagnosis Date  ? Hyperlipidemia   ? Migraines   ? Post partum thyroiditis   ? Dr. Morrison Old  ? ? ?Past Surgical History:  ?Procedure Laterality Date  ? VEIN SURGERY Right 01/2015  ? [place] Vein and laser.   ? WISDOM TOOTH EXTRACTION    ? ? ?Social History  ? ?Socioeconomic History  ? Marital status: Married  ?  Spouse name: Not on file  ? Number of children: 2  ? Years of education: Not on file  ? Highest education level: Not on file  ?Occupational History  ? Occupation: Chief Financial Officer    ?Tobacco Use  ? Smoking status: Never  ? Smokeless tobacco: Never  ?Substance and Sexual Activity  ? Alcohol use: Not Currently  ? Drug use: Never  ? Sexual activity: Not on file  ?  Comment: Occupational psychologist, runs 2 X week, eats healthy, married, no kids.  ?Other Topics Concern  ? Not on file  ?Social History Narrative  ? Exercise 2 days a week of kickboxing. Drinks caffeine daily.   ? ?Social  Determinants of Health  ? ?Financial Resource Strain: Not on file  ?Food Insecurity: Not on file  ?Transportation Needs: Not on file  ?Physical Activity: Not on file  ?Stress: Not on file  ?Social Connections: Not on file  ? ? ?Family History  ?Problem Relation Age of Onset  ? Hyperlipidemia Mother   ? Hypertension Mother   ? Heart attack Father   ? Hyperlipidemia Father   ? Hypertension Sister   ? Heart attack Paternal Grandfather   ? Stroke Paternal Grandfather   ? ? ?Health Maintenance  ?Topic Date Due  ? Hepatitis C Screening  Never done  ? COVID-19 Vaccine (4 - Booster for Pfizer series) 04/08/2020  ? MAMMOGRAM  09/04/2022  ? PAP SMEAR-Modifier  05/01/2025  ? TETANUS/TDAP  01/10/2027  ? INFLUENZA VACCINE  Completed  ? HIV Screening  Completed  ? Pneumococcal Vaccine 61-106 Years old  Aged Out  ? HPV VACCINES  Aged Out  ? ? ? ?----------------------------------------------------------------------------------------------------------------------------------------------------------------------------------------------------------------- ?Physical Exam ?BP (!) 164/112 (BP Location: Left Arm, Patient Position: Sitting, Cuff Size: Normal)   Pulse 90   Temp 97.7 ?F (36.5 ?C)   Ht 5\' 3"  (1.6 m)   Wt 141 lb (64 kg)   SpO2 98%   BMI 24.98 kg/m?  ? ?Physical Exam ?Constitutional:   ?   Appearance: Normal  appearance.  ?Cardiovascular:  ?   Rate and Rhythm: Normal rate and regular rhythm.  ?Pulmonary:  ?   Effort: Pulmonary effort is normal.  ?   Breath sounds: Normal breath sounds.  ?Musculoskeletal:  ?   Cervical back: Neck supple.  ?Neurological:  ?   General: No focal deficit present.  ?   Mental Status: She is alert.  ?   Cranial Nerves: No cranial nerve deficit.  ? ? ?------------------------------------------------------------------------------------------------------------------------------------------------------------------------------------------------------------------- ?Assessment and Plan ? ?Migraine  headache ?Acute migraine treated with combination of toradol, phenergan and dexamethasone.  Adding zofran if needed for any continued nausea.  Possibly precipitated by increased stress as well as coming off of OCP.  BP elevated today as well.  Encouraged to keep follow up with PCP regarding BP.   ? ?Elevated BP without diagnosis of hypertension ?BP elevated today.  Likely worsened by migraine.  F/u in 2 weeks for nurse visit BP check.  ? ? ?No orders of the defined types were placed in this encounter. ? ? ?No follow-ups on file. ? ? ? ?This visit occurred during the SARS-CoV-2 public health emergency.  Safety protocols were in place, including screening questions prior to the visit, additional usage of staff PPE, and extensive cleaning of exam room while observing appropriate contact time as indicated for disinfecting solutions.  ? ?

## 2021-07-12 NOTE — Addendum Note (Signed)
Addended by: Aron Baba on: 07/12/2021 11:19 AM ? ? Modules accepted: Orders ? ?

## 2021-07-12 NOTE — Assessment & Plan Note (Signed)
BP elevated today.  Likely worsened by migraine.  F/u in 2 weeks for nurse visit BP check.  ?

## 2021-07-12 NOTE — Assessment & Plan Note (Signed)
Acute migraine treated with combination of toradol, phenergan and dexamethasone.  Adding zofran if needed for any continued nausea.  Possibly precipitated by increased stress as well as coming off of OCP.  BP elevated today as well.  Encouraged to keep follow up with PCP regarding BP.   ?

## 2021-07-20 ENCOUNTER — Emergency Department
Admission: EM | Admit: 2021-07-20 | Discharge: 2021-07-20 | Disposition: A | Discharge status: Home / Self Care | Payer: BC Managed Care – PPO | Source: Home / Self Care | Attending: Family Medicine | Admitting: Family Medicine

## 2021-07-20 ENCOUNTER — Other Ambulatory Visit: Payer: Self-pay

## 2021-07-20 DIAGNOSIS — Z91030 Bee allergy status: Secondary | ICD-10-CM | POA: Insufficient documentation

## 2021-07-20 DIAGNOSIS — T63441A Toxic effect of venom of bees, accidental (unintentional), initial encounter: Secondary | ICD-10-CM

## 2021-07-20 MED ORDER — HYDROXYZINE HCL 25 MG PO TABS: 25.0000 mg | ORAL_TABLET | Freq: Four times a day (QID) | ORAL | 0 refills | Status: DC

## 2021-07-20 MED ORDER — METHYLPREDNISOLONE ACETATE 80 MG/ML IJ SUSP
80.0000 mg | Freq: Once | INTRAMUSCULAR | Status: AC
  Administered 2021-07-20: 80 mg via INTRAMUSCULAR

## 2021-07-20 NOTE — ED Provider Notes (Signed)
?[facility] ? ? ? ?CSN: [id number] ?Arrival date & time: 07/20/21  1059 ? ? ?  ? ?History   ?Chief Complaint ?Chief Complaint  ?Patient presents with  ? Insect Bite  ?  Bee sting on right foot x2 days  ? ? ?HPI ?Tina Pearson is a 41 y.o. female.  ? ?HPI ?Patient has a known allergy to bees.  Carries an EpiPen.  Other flying insects also caused her to have a reaction.  Today she is here because she stepped on a wasp and it stung the arch of her foot.  The foot is swollen red and painful.  This is her right foot.  She has no swelling of the lips or throat, no trouble breathing or talking ?She is taking Benadryl but states it still intensely itching ? ?Past Medical History:  ?Diagnosis Date  ? Hyperlipidemia   ? Migraines   ? Post partum thyroiditis   ? Dr. Morrison Old  ? ? ?Patient Active Problem List  ? Diagnosis Date Noted  ? Bee sting allergy 07/20/2021  ? Elevated BP without diagnosis of hypertension 07/12/2021  ? Post partum thyroiditis   ? Primary osteoarthritis of left knee 07/30/2020  ? Ingrown toenail of left foot 07/30/2020  ? Neural foraminal stenosis of cervical spine 01/02/2019  ? DDD (degenerative disc disease), cervical 01/02/2019  ? Chronic pain disorder 01/02/2019  ? Cervical spondylosis with radiculopathy 01/02/2019  ? Arthropathy of cervical facet joint 01/02/2019  ? Radiculitis of left cervical region 10/01/2018  ? History of thyroiditis 05/29/2015  ? Goiter 01/31/2014  ? Thyroid nodule 09/05/2013  ? ALLERGIC RHINITIS 03/08/2010  ? PURE HYPERCHOLESTEROLEMIA 02/25/2010  ? Migraine headache 02/25/2010  ? IRREGULAR MENSTRUAL CYCLE 02/25/2010  ? ? ?Past Surgical History:  ?Procedure Laterality Date  ? VEIN SURGERY Right 01/2015  ? [place] Vein and laser.   ? WISDOM TOOTH EXTRACTION    ? ? ?OB History   ?No obstetric history on file. ?  ? ? ? ?Home Medications   ? ?Prior to Admission medications   ?Medication Sig Start Date End Date Taking? Authorizing Provider  ?atorvastatin (LIPITOR) 20 MG tablet  TAKE 1 TABLET AT BEDTIME 09/19/20  Yes Agapito Games, MD  ?EPINEPHrine 0.3 mg/0.3 mL IJ SOAJ injection Inject into the skin. 12/15/16  Yes [provider]  ?frovatriptan (FROVA) 2.5 MG tablet TAKE 1 TABLET DAILY AS NEEDED FOR MIGRAINE. IF RECURS, MAY REPEAT AFTER 2 HOURS. MAX OF 3 TABLETS IN 24 HOURS. 06/05/21  Yes Agapito Games, MD  ?hydrOXYzine (ATARAX) 25 MG tablet Take 1 tablet (25 mg total) by mouth every 6 (six) hours. 07/20/21  Yes Eustace Moore, MD  ?levonorgestrel-ethinyl estradiol (SEASONALE) 0.15-0.03 MG tablet TAKE 1 TABLET DAILY 12/11/20  Yes Agapito Games, MD  ?ondansetron (ZOFRAN-ODT) 4 MG disintegrating tablet Take 1 tablet (4 mg total) by mouth every 8 (eight) hours as needed for nausea or vomiting. 07/12/21  Yes Everrett Coombe, DO  ? ? ?Family History ?Family History  ?Problem Relation Age of Onset  ? Hyperlipidemia Mother   ? Hypertension Mother   ? Heart attack Father   ? Hyperlipidemia Father   ? Hypertension Sister   ? Heart attack Paternal Grandfather   ? Stroke Paternal Grandfather   ? ? ?Social History ?Social History  ? ?Tobacco Use  ? Smoking status: Never  ? Smokeless tobacco: Never  ?Substance Use Topics  ? Alcohol use: Not Currently  ? Drug use: Never  ? ? ? ?  Allergies   ?Chlorpheniramine-codeine, Dextromethorphan, Sumatriptan, and Yellow jacket venom [bee venom] ? ? ?Review of Systems ?Review of Systems ?See HPI ? ?Physical Exam ?Triage Vital Signs ?ED Triage Vitals  ?Enc Vitals Group  ?   BP 07/20/21 1122 (!) 162/107  ?   Pulse Rate 07/20/21 1122 98  ?   Resp 07/20/21 1122 19  ?   Temp 07/20/21 1122 98 ?F (36.7 ?C)  ?   Temp Source 07/20/21 1122 Oral  ?   SpO2 07/20/21 1122 97 %  ?   Weight 07/20/21 1120 135 lb (61.2 kg)  ?   Height 07/20/21 1120 5\' 3"  (1.6 m)  ?   Head Circumference --   ?   Peak Flow --   ?   Pain Score 07/20/21 1120 6  ?   Pain Loc --   ?   Pain Edu? --   ?   Excl. in GC? --   ? ?No data found. ? ?Updated Vital Signs ?BP (!) 140/98 (BP  Location: Left Arm)   Pulse 98   Temp 98 ?F (36.7 ?C) (Oral)   Resp 19   Ht 5\' 3"  (1.6 m)   Wt 61.2 kg   LMP 07/08/2021   SpO2 97%   BMI 23.91 kg/m?  ?   ? ?Physical Exam ?Constitutional:   ?   General: She is not in acute distress. ?   Appearance: Normal appearance. She is well-developed.  ?HENT:  ?   Head: Normocephalic and atraumatic.  ?   Mouth/Throat:  ?   Pharynx: No oropharyngeal exudate or posterior oropharyngeal erythema.  ?Eyes:  ?   Conjunctiva/sclera: Conjunctivae normal.  ?   Pupils: Pupils are equal, round, and reactive to light.  ?Cardiovascular:  ?   Rate and Rhythm: Normal rate and regular rhythm.  ?Pulmonary:  ?   Effort: Pulmonary effort is normal. No respiratory distress.  ?   Breath sounds: Normal breath sounds.  ?Abdominal:  ?   General: There is no distension.  ?   Palpations: Abdomen is soft.  ?Musculoskeletal:     ?   General: Normal range of motion.  ?   Cervical back: Normal range of motion.  ?Skin: ?   General: Skin is warm and dry.  ?   Comments: Right foot has soft tissue swelling from the arch up around the dorsum of the foot with erythema and mild tenderness.  ?Neurological:  ?   Mental Status: She is alert.  ? ? ? ?UC Treatments / Results  ?Labs ?(all labs ordered are listed, but only abnormal results are displayed) ?Labs Reviewed - No data to display ? ?EKG ? ? ?Radiology ?No results found. ? ?Procedures ?Procedures (including critical care time) ? ?Medications Ordered in UC ?Medications  ?methylPREDNISolone acetate (DEPO-MEDROL) injection 80 mg (80 mg Intramuscular Given 07/20/21 1220)  ? ? ?Initial Impression / Assessment and Plan / UC Course  ?I have reviewed the triage vital signs and the nursing notes. ? ?Pertinent labs & imaging results that were available during my care of the patient were reviewed by me and considered in my medical decision making (see chart for details). ? ?  ? ?Local allergic reaction to wasp sting ?Final Clinical Impressions(s) / UC Diagnoses   ? ?Final diagnoses:  ?Bee sting reaction, accidental or unintentional, initial encounter  ? ? ? ?Discharge Instructions   ? ?  ?Try cool compresses ?Take atarax for itching ?The steroid shot will last a couple of days ? ? ?  ED Prescriptions   ? ? Medication Sig Dispense Auth. Provider  ? hydrOXYzine (ATARAX) 25 MG tablet Take 1 tablet (25 mg total) by mouth every 6 (six) hours. 12 tablet Eustace MooreNelson, Coe Angelos Sue, MD  ? ?  ? ?PDMP not reviewed this encounter. ?  ?Eustace MooreNelson, Janequa Kipnis Sue, MD ?07/20/21 1315 ? ?

## 2021-07-20 NOTE — Discharge Instructions (Signed)
Try cool compresses ?Take atarax for itching ?The steroid shot will last a couple of days ?

## 2021-07-20 NOTE — ED Triage Notes (Signed)
Pt states that she was stung by wasp on her right foot. X2 days ? ?Pt states that she has some itching, pain and swelling of her right foot. X2 days ?

## 2021-07-26 ENCOUNTER — Other Ambulatory Visit: Payer: Self-pay

## 2021-07-26 ENCOUNTER — Ambulatory Visit: Payer: BC Managed Care – PPO | Admitting: Sports Medicine

## 2021-07-26 DIAGNOSIS — H8113 Benign paroxysmal vertigo, bilateral: Secondary | ICD-10-CM | POA: Diagnosis not present

## 2021-07-26 DIAGNOSIS — H8110 Benign paroxysmal vertigo, unspecified ear: Secondary | ICD-10-CM | POA: Insufficient documentation

## 2021-07-26 MED ORDER — MECLIZINE HCL 25 MG PO TABS: 25.0000 mg | ORAL_TABLET | Freq: Three times a day (TID) | ORAL | 3 refills | Status: DC | PRN

## 2021-07-26 MED ORDER — PREDNISONE 10 MG (48) PO TBPK: ORAL_TABLET | Freq: Every day | ORAL | 0 refills | Status: DC

## 2021-07-26 MED ORDER — DIAZEPAM 5 MG PO TABS: 5.0000 mg | ORAL_TABLET | Freq: Three times a day (TID) | ORAL | 0 refills | Status: DC | PRN

## 2021-07-26 NOTE — Progress Notes (Signed)
? ? ?  Procedures performed today:   ? ?None. ? ?Independent interpretation of notes and tests performed by another provider:  ? ?None. ? ?Brief History, Exam, Impression, and Recommendations:   ? ?Benign paroxysmal positional vertigo ?This is a pleasant 41 year old female, she has a several day history of increasing pressure and pain in both ears, as well as a spinning sensation when she changes position of her head. ?She has had an episode of positional vertigo in the past treated by Dr. Georgina Snell. ?She did have some Valium left over which only helped a bit. ?Her oropharyngeal exam is unremarkable, we did not do the Dix-Hallpike maneuver considering her current symptoms, I have advised her to avoid head position changes if at all possible, avoid driving so she will work at home, adding a 12-day prednisone taper, meclizine for vertigo, Valium for breakthrough vertigo. ?We also discussed the anatomy and pathophysiology as well as the presence of otoliths in the Epley maneuver to rid the semicircular canals of the otolith. ?She will do this in a few days when she starts to feel better. ?Return to see me in 2 weeks. ?We can certainly add formal vestibular physical therapy if insufficiently better. ?No focal neurologic symptoms, no viral symptoms. ?She did have a touch of ringing in the right ear and so we also need to keep M?ni?re's disease in the differential. ? ? ? ?___________________________________________ ?Gwen Her. Dianah Field, M.D., ABFM., CAQSM. ?Primary Care and Sports Medicine ?[place] ? ?Adjunct Instructor of Family Medicine  ?[employer] ?

## 2021-07-26 NOTE — Assessment & Plan Note (Signed)
This is a pleasant 41 year old female, she has a several day history of increasing pressure and pain in both ears, as well as a spinning sensation when she changes position of her head. ?She has had an episode of positional vertigo in the past treated by Dr. Denyse Amass. ?She did have some Valium left over which only helped a bit. ?Her oropharyngeal exam is unremarkable, we did not do the Dix-Hallpike maneuver considering her current symptoms, I have advised her to avoid head position changes if at all possible, avoid driving so she will work at home, adding a 12-day prednisone taper, meclizine for vertigo, Valium for breakthrough vertigo. ?We also discussed the anatomy and pathophysiology as well as the presence of otoliths in the Epley maneuver to rid the semicircular canals of the otolith. ?She will do this in a few days when she starts to feel better. ?Return to see me in 2 weeks. ?We can certainly add formal vestibular physical therapy if insufficiently better. ?No focal neurologic symptoms, no viral symptoms. ?She did have a touch of ringing in the right ear and so we also need to keep M?ni?re's disease in the differential. ?

## 2021-08-13 ENCOUNTER — Ambulatory Visit: Payer: BC Managed Care – PPO | Admitting: Sports Medicine

## 2021-08-13 DIAGNOSIS — G43909 Migraine, unspecified, not intractable, without status migrainosus: Secondary | ICD-10-CM | POA: Diagnosis not present

## 2021-08-13 DIAGNOSIS — H8113 Benign paroxysmal vertigo, bilateral: Secondary | ICD-10-CM | POA: Diagnosis not present

## 2021-08-13 NOTE — Assessment & Plan Note (Signed)
Tina Pearson also gets regular headaches, suspect migrainous, she also has had a few episodes of vertigo, considering the focal neurologic symptom of vertigo we will proceed with brain MRI with and without contrast. ?

## 2021-08-13 NOTE — Progress Notes (Signed)
? ? ?  Procedures performed today:   ? ?None. ? ?Independent interpretation of notes and tests performed by another provider:  ? ?None. ? ?Brief History, Exam, Impression, and Recommendations:   ? ?Benign paroxysmal positional vertigo ?95% better with steroids, meclizine, Epley maneuver. ?Continue Epley maneuver for another couple of weeks. ? ?Migraine headache ?Tina Pearson also gets regular headaches, suspect migrainous, she also has had a few episodes of vertigo, considering the focal neurologic symptom of vertigo we will proceed with brain MRI with and without contrast. ? ? ? ?___________________________________________ ?Ihor Austin. Benjamin Stain, M.D., ABFM., CAQSM. ?Primary Care and Sports Medicine ?[facility] ? ?Adjunct Instructor of Family Medicine  ?[employer] ?

## 2021-08-13 NOTE — Assessment & Plan Note (Signed)
95% better with steroids, meclizine, Epley maneuver. ?Continue Epley maneuver for another couple of weeks. ?

## 2021-08-19 ENCOUNTER — Encounter: Payer: Self-pay | Admitting: Family Medicine

## 2021-08-19 ENCOUNTER — Ambulatory Visit: Payer: BC Managed Care – PPO | Admitting: Family Medicine

## 2021-08-19 VITALS — BP 167/107 | HR 89 | Resp 16 | Ht 63.0 in | Wt 143.0 lb

## 2021-08-19 DIAGNOSIS — G43909 Migraine, unspecified, not intractable, without status migrainosus: Secondary | ICD-10-CM | POA: Diagnosis not present

## 2021-08-19 DIAGNOSIS — H8113 Benign paroxysmal vertigo, bilateral: Secondary | ICD-10-CM

## 2021-08-19 DIAGNOSIS — I10 Essential (primary) hypertension: Secondary | ICD-10-CM

## 2021-08-19 DIAGNOSIS — N920 Excessive and frequent menstruation with regular cycle: Secondary | ICD-10-CM

## 2021-08-19 MED ORDER — LOSARTAN POTASSIUM 25 MG PO TABS: 25.0000 mg | ORAL_TABLET | Freq: Every day | ORAL | 0 refills | Status: DC

## 2021-08-19 MED ORDER — LEVONORGEST-ETH ESTRAD 91-DAY 0.15-0.03 MG PO TABS: 1.0000 | ORAL_TABLET | Freq: Every day | ORAL | 3 refills | Status: DC

## 2021-08-19 NOTE — Patient Instructions (Signed)
Continue to monitor blood pressure.  Ring in home log with you. ?

## 2021-08-19 NOTE — Progress Notes (Signed)
? ?Established Patient Office Visit ? ?Subjective:  ?Patient ID: Tina Pearson, female    DOB: 1980-05-14  Age: 41 y.o. MRN: [id number] ? ?CC:  ?Chief Complaint  ?Patient presents with  ? Hypertension  ?  Follow up   ? ? ?HPI ?Tina Pearson presents for  ? ?Hypertension- Pt denies chest pain, SOB, dizziness, or heart palpitations.  Taking meds as directed w/o problems.  Denies medication side effects.  Her blood pressures have been elevated the last couple of times that she has been seen here.  She did bring in a home blood pressure log a lot of the pressures are mostly running in the 140s.  Some as high as 150s and some blood pressures look pretty good in the 120s and 130s.  ? ?She was recently seen by one of my partners for BPPV.  She improved significantly with steroids meclizine and Epley maneuver.  She had also been getting more frequent migraine headaches.  He is scheduled for brain MRI tomorrow. ? ?Was also seen for a waspsting in March at the urgent care. ? ?Past Medical History:  ?Diagnosis Date  ? Hyperlipidemia   ? Migraines   ? Post partum thyroiditis   ? Dr. Steffanie Dunn  ? ? ?Past Surgical History:  ?Procedure Laterality Date  ? VEIN SURGERY Right 01/2015  ? [place] [facility].   ? WISDOM TOOTH EXTRACTION    ? ? ?Family History  ?Problem Relation Age of Onset  ? Hyperlipidemia Mother   ? Hypertension Mother   ? Heart attack Father   ? Hyperlipidemia Father   ? Hypertension Sister   ? Heart attack Paternal Grandfather   ? Stroke Paternal Grandfather   ? ? ?Social History  ? ?Socioeconomic History  ? Marital status: Married  ?  Spouse name: Not on file  ? Number of children: 2  ? Years of education: Not on file  ? Highest education level: Not on file  ?Occupational History  ? Occupation: Pharmacologist    ?Tobacco Use  ? Smoking status: Never  ? Smokeless tobacco: Never  ?Substance and Sexual Activity  ? Alcohol use: Not Currently  ? Drug use: Never  ? Sexual activity: Not on file  ?  Comment: Copy,  runs 2 X week, eats healthy, married, no kids.  ?Other Topics Concern  ? Not on file  ?Social History Narrative  ? Exercise 2 days a week of kickboxing. Drinks caffeine daily.   ? ?Social Determinants of Health  ? ?Financial Resource Strain: Not on file  ?Food Insecurity: Not on file  ?Transportation Needs: Not on file  ?Physical Activity: Not on file  ?Stress: Not on file  ?Social Connections: Not on file  ?Intimate Partner Violence: Not on file  ? ? ?Outpatient Medications Prior to Visit  ?Medication Sig Dispense Refill  ? atorvastatin (LIPITOR) 20 MG tablet TAKE 1 TABLET AT BEDTIME 90 tablet 3  ? diazepam (VALIUM) 5 MG tablet Take 1 tablet (5 mg total) by mouth every 8 (eight) hours as needed (vertigo/dizziness). 30 tablet 0  ? EPINEPHrine 0.3 mg/0.3 mL IJ SOAJ injection Inject into the skin.    ? frovatriptan (FROVA) 2.5 MG tablet TAKE 1 TABLET DAILY AS NEEDED FOR MIGRAINE. IF RECURS, MAY REPEAT AFTER 2 HOURS. MAX OF 3 TABLETS IN 24 HOURS. 27 tablet 9  ? meclizine (ANTIVERT) 25 MG tablet Take 1 tablet (25 mg total) by mouth 3 (three) times daily as needed for dizziness or nausea. 30 tablet 3  ?  levonorgestrel-ethinyl estradiol (SEASONALE) 0.15-0.03 MG tablet TAKE 1 TABLET DAILY (Patient not taking: Reported on 08/19/2021) 91 tablet 1  ? predniSONE (STERAPRED UNI-PAK 48 TAB) 10 MG (48) TBPK tablet Take by mouth daily. 12-day taper pack, use as directed for taper 1 tablet 0  ? ?No facility-administered medications prior to visit.  ? ? ?Allergies  ?Allergen Reactions  ? Chlorpheniramine-Codeine Other (See Comments)  ?  Headaches, dizziness, palpitations, "drunk feeling"  ? Dextromethorphan Other (See Comments)  ?  Migraine headaches,   ? Sumatriptan Nausea And Vomiting  ? [place] Venom [Bee Venom] Swelling and Rash  ? ? ?ROS ?Review of Systems ? ?  ?Objective:  ?  ?Physical Exam ?Constitutional:   ?   Appearance: Normal appearance. She is well-developed.  ?HENT:  ?   Head: Normocephalic and atraumatic.   ?Cardiovascular:  ?   Rate and Rhythm: Normal rate and regular rhythm.  ?   Heart sounds: Normal heart sounds.  ?Pulmonary:  ?   Effort: Pulmonary effort is normal.  ?   Breath sounds: Normal breath sounds.  ?Skin: ?   General: Skin is warm and dry.  ?Neurological:  ?   Mental Status: She is alert and oriented to person, place, and time.  ?Psychiatric:     ?   Behavior: Behavior normal.  ? ? ?BP (!) 167/107   Pulse 89   Resp 16   Ht 5' 3"  (1.6 m)   Wt 143 lb (64.9 kg)   SpO2 99%   BMI 25.33 kg/m?  ?Wt Readings from Last 3 Encounters:  ?08/19/21 143 lb (64.9 kg)  ?07/26/21 139 lb 1.3 oz (63.1 kg)  ?07/20/21 135 lb (61.2 kg)  ? ? ? ?Health Maintenance Due  ?Topic Date Due  ? Hepatitis C Screening  Never done  ? ? ?There are no preventive care reminders to display for this patient. ? ?Lab Results  ?Component Value Date  ? TSH 1.96 05/22/2021  ? ?Lab Results  ?Component Value Date  ? WBC 7.3 05/22/2021  ? HGB 14.9 05/22/2021  ? HCT 45.2 (H) 05/22/2021  ? MCV 89.0 05/22/2021  ? PLT 248 05/22/2021  ? ?Lab Results  ?Component Value Date  ? NA 138 05/22/2021  ? K 4.3 05/22/2021  ? CO2 30 05/22/2021  ? GLUCOSE 85 05/22/2021  ? BUN 10 05/22/2021  ? CREATININE 0.94 05/22/2021  ? BILITOT 0.5 05/22/2021  ? ALKPHOS 66 07/30/2016  ? AST 11 05/22/2021  ? ALT 9 05/22/2021  ? PROT 7.3 05/22/2021  ? ALBUMIN 4.2 07/30/2016  ? CALCIUM 9.7 05/22/2021  ? EGFR 79 05/22/2021  ? ?Lab Results  ?Component Value Date  ? CHOL 174 05/22/2021  ? ?Lab Results  ?Component Value Date  ? HDL 40 (L) 05/22/2021  ? ?Lab Results  ?Component Value Date  ? LDLCALC 110 (H) 05/22/2021  ? ?Lab Results  ?Component Value Date  ? TRIG 125 05/22/2021  ? ?Lab Results  ?Component Value Date  ? CHOLHDL 4.4 05/22/2021  ? ?No results found for: HGBA1C ? ?  ?Assessment & Plan:  ? ?Problem List Items Addressed This Visit   ? ?  ? Cardiovascular and Mediastinum  ? Migraine headache  ?  Did have her come off of her birth control for over a month to see if that was  causing her blood pressure elevations.  Per her blood pressure log it does not seem to have made a difference her blood pressure is still high even after 6 weeks  off of the medication.  Okay to restart birth control it does help with her menstrual migraines.  We will go ahead and refill medication. ? ?  ?  ? Relevant Medications  ? losartan (COZAAR) 25 MG tablet  ? Essential hypertension  ?  We will go ahead and start losartan 25 mg.  We will send it to mail order [facility] likely take about a week to get again.  Continue to monitor home blood pressures and bring log in with her for nurse visit when she has been on the medication for about 2 weeks.  We will need an updated BMP at that time with new start of ARB.  She did want to stay away from lisinopril. ? ?  ?  ? Relevant Medications  ? losartan (COZAAR) 25 MG tablet  ?  ? Nervous and Auditory  ? Benign paroxysmal positional vertigo - Primary  ?  She feels significantly better but still having some mild residual symptoms she still doing the exercises.  She has MRI scheduled for tomorrow so we will keep tabs on that. ? ?  ?  ? ?Other Visit Diagnoses   ? ? Menorrhagia with regular cycle      ? Relevant Medications  ? levonorgestrel-ethinyl estradiol (SEASONALE) 0.15-0.03 MG tablet  ? ?  ? ? ?Meds ordered this encounter  ?Medications  ? levonorgestrel-ethinyl estradiol (SEASONALE) 0.15-0.03 MG tablet  ?  Sig: Take 1 tablet by mouth daily.  ?  Dispense:  91 tablet  ?  Refill:  3  ? losartan (COZAAR) 25 MG tablet  ?  Sig: Take 1 tablet (25 mg total) by mouth daily.  ?  Dispense:  90 tablet  ?  Refill:  0  ? ? ?Follow-up: Return in about 3 weeks (around 09/09/2021) for Hypertension, nurse visit .  ? ? ?Beatrice Lecher, MD ?

## 2021-08-19 NOTE — Assessment & Plan Note (Signed)
Did have her come off of her birth control for over a month to see if that was causing her blood pressure elevations.  Per her blood pressure log it does not seem to have made a difference her blood pressure is still high even after 6 weeks off of the medication.  Okay to restart birth control it does help with her menstrual migraines.  We will go ahead and refill medication. ?

## 2021-08-19 NOTE — Assessment & Plan Note (Signed)
She feels significantly better but still having some mild residual symptoms she still doing the exercises.  She has MRI scheduled for tomorrow so we will keep tabs on that. ?

## 2021-08-19 NOTE — Assessment & Plan Note (Signed)
We will go ahead and start losartan 25 mg.  We will send it to mail order [facility] likely take about a week to get again.  Continue to monitor home blood pressures and bring log in with her for nurse visit when she has been on the medication for about 2 weeks.  We will need an updated BMP at that time with new start of ARB.  She did want to stay away from lisinopril. ?

## 2021-08-20 ENCOUNTER — Encounter: Payer: Self-pay | Admitting: Sports Medicine

## 2021-08-20 ENCOUNTER — Ambulatory Visit (INDEPENDENT_AMBULATORY_CARE_PROVIDER_SITE_OTHER): Payer: BC Managed Care – PPO

## 2021-08-20 DIAGNOSIS — G43909 Migraine, unspecified, not intractable, without status migrainosus: Secondary | ICD-10-CM

## 2021-08-20 DIAGNOSIS — R519 Headache, unspecified: Secondary | ICD-10-CM | POA: Diagnosis not present

## 2021-08-20 IMAGING — MR MR HEAD WO/W CM
12 series · 48 of 48 positions shown · IV contrast (gadavist)
Comparison: No pertinent prior exams available for comparison.

CLINICAL DATA: Headache, chronic, no new features. Migraine without
status migrainosus, not intractable, unspecified migraine type.
Additional history provided by scanning technologist: Patient
reports chronic migraines for years, vertigo for a few weeks.

EXAM:
MRI HEAD WITHOUT AND WITH CONTRAST
TECHNIQUE: Multiplanar, multiecho pulse sequences of the brain and surrounding
structures were obtained without and with intravenous contrast.
CONTRAST:  7mL GADAVIST GADOBUTROL 1 MMOL/ML IV SOLN

[Series 2: DWI · axial · 3.0mm · 1.46mm/px · z∈[-80,+82]mm · 8 of 110 slices shown (1 of 4)]
[im 1/110]
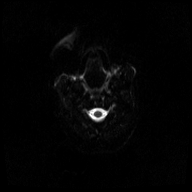
[im 16/110]
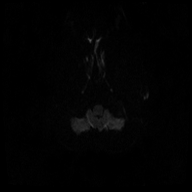
[im 32/110]
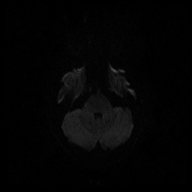
[im 47/110]
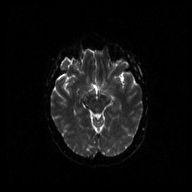
[im 63/110]
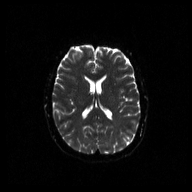
[im 78/110]
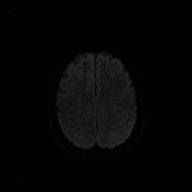
[im 94/110]
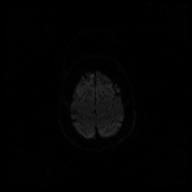
[im 110/110]
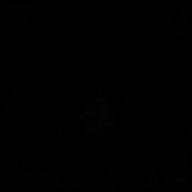

[Series 3: DWI · axial · 3.0mm · 1.46mm/px · z∈[-80,+82]mm · 4 of 55 slices shown (2 of 4)]
[im 1/55]
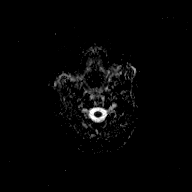
[im 19/55]
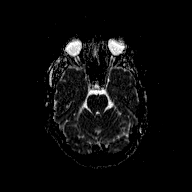
[im 37/55]
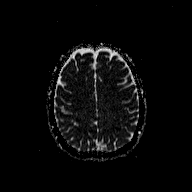
[im 55/55]
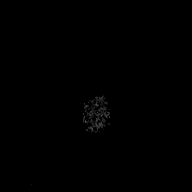

[Series 4: DWI · coronal · 5.0mm · 1.46mm/px · 4 of 60 slices shown (3 of 4)]
[im 1/60]
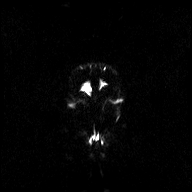
[im 20/60]
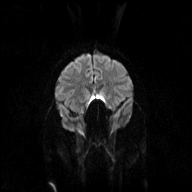
[im 40/60]
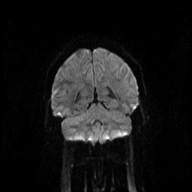
[im 60/60]
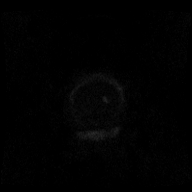

[Series 5: DWI · coronal · 5.0mm · 1.46mm/px · 2 of 30 slices shown (4 of 4)]
[im 1/30]
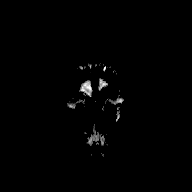
[im 30/30]
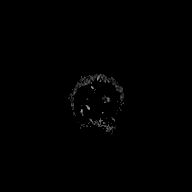

[Series 6: T1 · sagittal · 5.0mm · 0.45mm/px · 1 of 23 slices shown (1 of 2)]
[im 1/23]
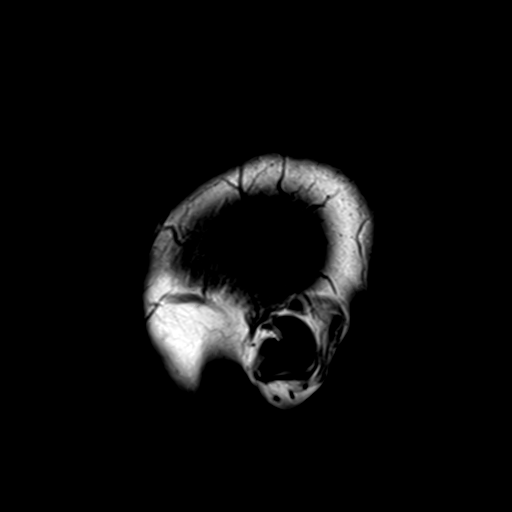

[Series 7: T2 · axial · 5.0mm · 0.72mm/px · 1 of 23 slices shown (1 of 2)]
[im 1/23]
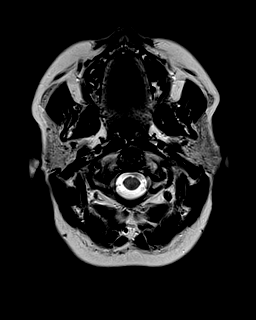

[Series 8: FLAIR · axial · 3.0mm · 0.45mm/px · z∈[-76,+86]mm · 3 of 55 slices shown]
[im 1/55]
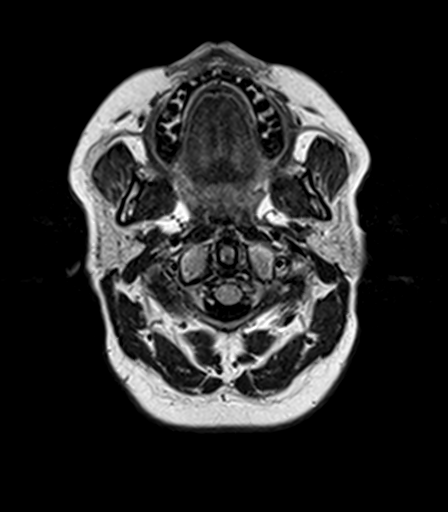
[im 28/55]
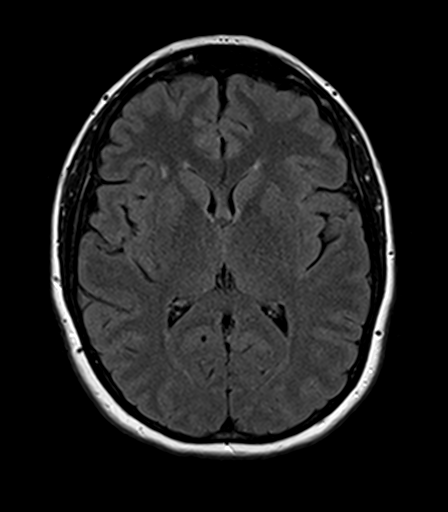
[im 55/55]
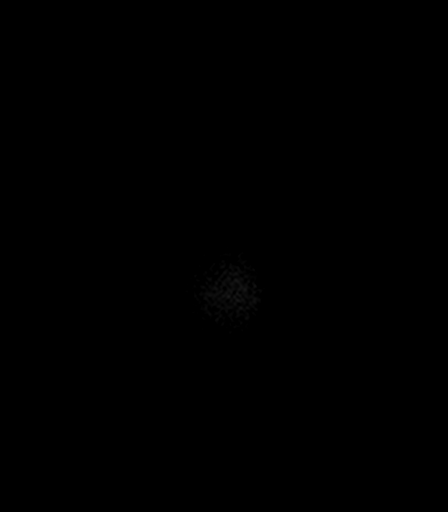

[Series 9: T2 · axial · 5.0mm · 0.72mm/px · 1 of 23 slices shown (2 of 2)]
[im 1/23]
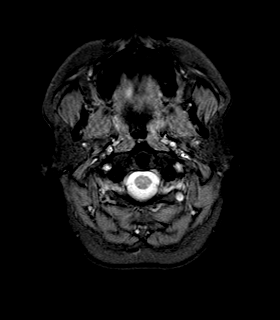

[Series 10: T1 · axial · 1.0mm · 0.94mm/px · z∈[-74,+85]mm · 10 of 160 slices shown (2 of 2)]
[im 1/160]
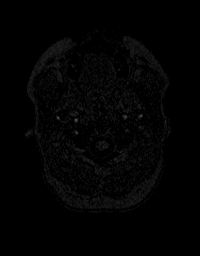
[im 18/160]
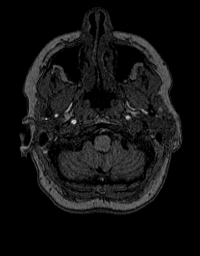
[im 36/160]
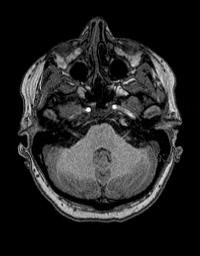
[im 54/160]
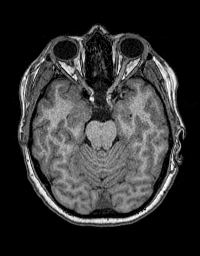
[im 71/160]
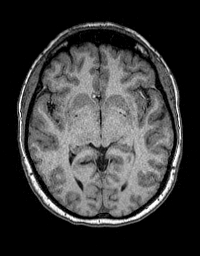
[im 89/160]
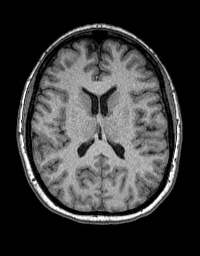
[im 107/160]
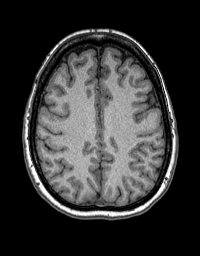
[im 124/160]
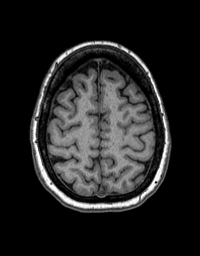
[im 142/160]
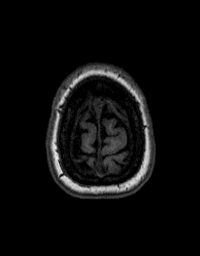
[im 160/160]
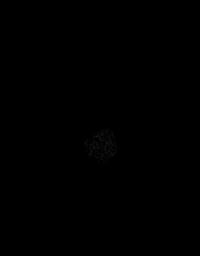

[Series 11: T2 post-contrast · coronal · 5.0mm · 0.43mm/px · 2 of 31 slices shown]
[im 1/31]
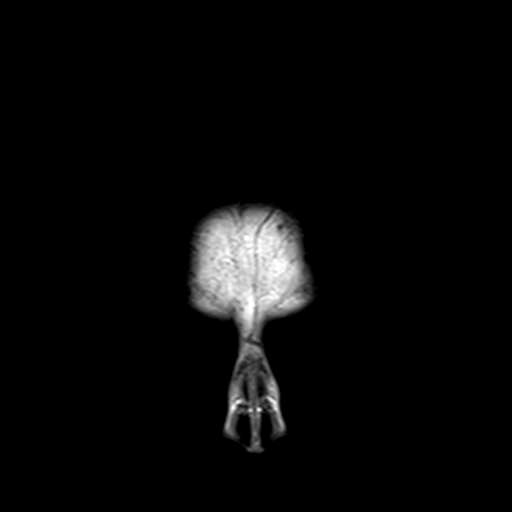
[im 31/31]
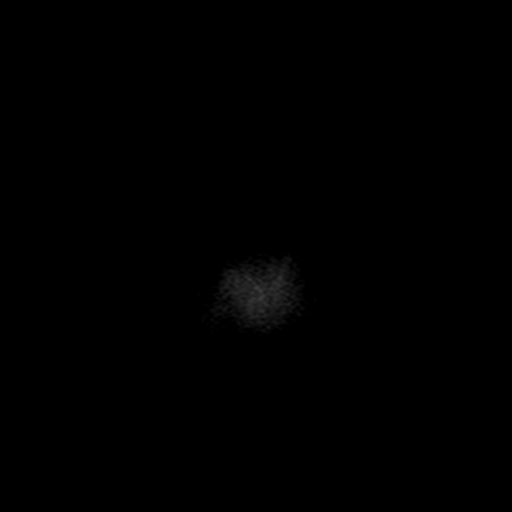

[Series 12: T1 post-contrast · axial · 1.0mm · 0.94mm/px · z∈[-74,+85]mm · 10 of 160 slices shown (1 of 2)]
[im 1/160]
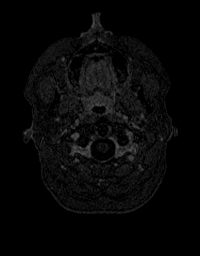
[im 18/160]
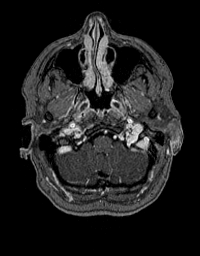
[im 36/160]
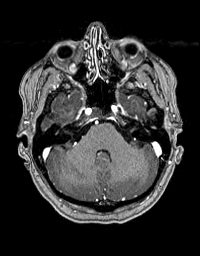
[im 54/160]
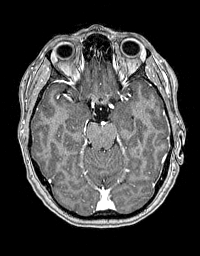
[im 71/160]
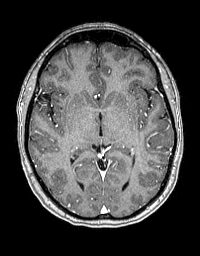
[im 89/160]
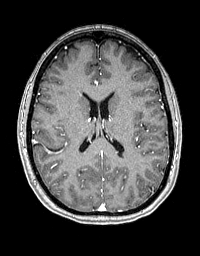
[im 107/160]
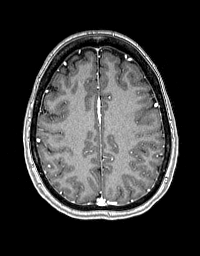
[im 124/160]
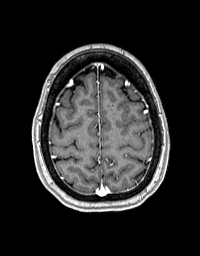
[im 142/160]
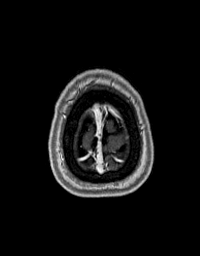
[im 160/160]
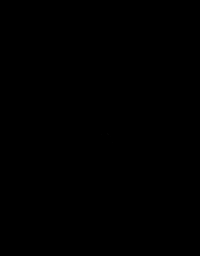

[Series 13: T1 post-contrast · coronal · 5.0mm · 0.43mm/px · 2 of 31 slices shown (2 of 2)]
[im 1/31]
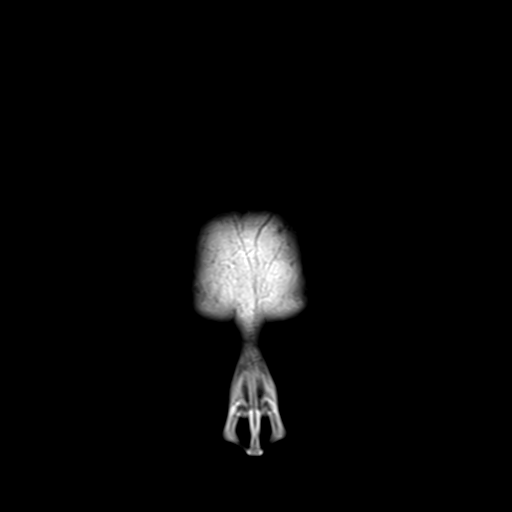
[im 31/31]
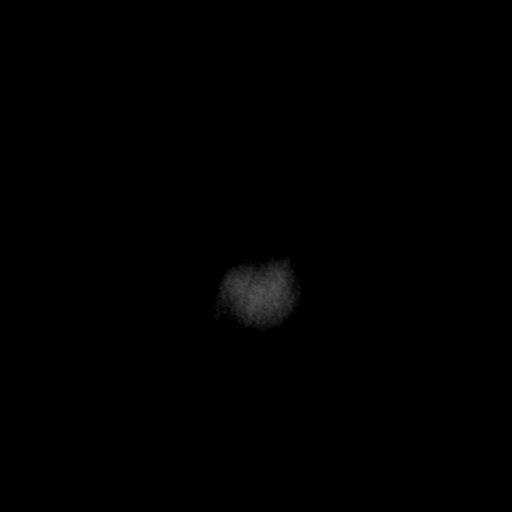

[48 of 48 positions shown; findings below may reference images not displayed]

FINDINGS: Brain:

Cerebral volume is normal.

There are a few small scattered foci of T2 FLAIR hyperintense signal
abnormality within the cerebral white matter, the largest within the
anterior right subinsular white matter measuring 4 mm (series 8,
image 28).

There is no acute infarct.

No evidence of an intracranial mass.

No chronic intracranial blood products.

No extra-axial fluid collection.

No midline shift.

No pathologic intracranial enhancement identified.

Vascular: Maintained flow voids within the proximal large arterial
vessels.

Skull and upper cervical spine: No focal suspicious marrow lesion

Sinuses/Orbits: Visualized orbits show no acute finding. Minimal
mucosal thickening within the bilateral ethmoid and right maxillary
sinuses.
IMPRESSION: No evidence of acute intracranial abnormality.

There are a few small nonspecific T2 FLAIR hyperintense remote
insults scattered within the cerebral white matter (measuring up to
4 mm).

Otherwise unremarkable MRI appearance of the brain.

Minimal mucosal thickening within the bilateral ethmoid and right
maxillary sinuses.

## 2021-08-20 MED ORDER — GADOBUTROL 1 MMOL/ML IV SOLN
7.0000 mL | Freq: Once | INTRAVENOUS | Status: AC | PRN
  Administered 2021-08-20: 7 mL via INTRAVENOUS

## 2021-08-21 ENCOUNTER — Other Ambulatory Visit: Payer: Self-pay | Admitting: Family Medicine

## 2021-09-10 ENCOUNTER — Ambulatory Visit (INDEPENDENT_AMBULATORY_CARE_PROVIDER_SITE_OTHER): Payer: BC Managed Care – PPO | Admitting: Family Medicine

## 2021-09-10 VITALS — BP 126/75 | HR 80

## 2021-09-10 DIAGNOSIS — Z1231 Encounter for screening mammogram for malignant neoplasm of breast: Secondary | ICD-10-CM | POA: Diagnosis not present

## 2021-09-10 DIAGNOSIS — I10 Essential (primary) hypertension: Secondary | ICD-10-CM | POA: Diagnosis not present

## 2021-09-10 LAB — HM MAMMOGRAPHY

## 2021-09-10 NOTE — Progress Notes (Signed)
? ?  Established Patient Office Visit ? ?Subjective   ?Patient ID: Tina Pearson, female    DOB: 1980-07-25  Age: 41 y.o. MRN: [id number] ? ?Chief Complaint  ?Patient presents with  ? Hypertension  ? ? ?HPI ? ?Tina Pearson is here for blood pressure check. Denies medication problems, chest pain, shortness of breath or dizziness.  ? ?Home readings look good -  ? ?131/90 ?126/76 ?118/79 ?97/75 ?121/81 ?115/78 ?119/78 ?109/78 ?109/78 ?109/74 ?124/81 ?117/77 ?121/76 ? ?ROS ? ?  ?Objective:  ?  ? ?BP 126/75   Pulse 80   SpO2 90%  ? ? ?Physical Exam ? ? ?No results found for any visits on 09/10/21. ? ? ? ?The 10-year ASCVD risk score (Arnett DK, et al., 2019) is: 1.1% ? ?  ?Assessment & Plan:  ?Hypertension - Blood pressure within normal limits. Patient advised to continue current medications as directed. Follow up in 3 months with Dr Linford Arnold.  ? ? ?Problem List Items Addressed This Visit   ? ? Essential hypertension - Primary  ? ? ?Return in about 3 months (around 12/11/2021) for with Dr Linford Arnold for hypertension. .  ? ? ?Esmond Harps, CMA ? ?

## 2021-09-10 NOTE — Progress Notes (Signed)
Agree with documentation as above.   Tina Reali, MD  

## 2021-09-13 ENCOUNTER — Encounter: Payer: Self-pay | Admitting: Family Medicine

## 2021-10-29 ENCOUNTER — Ambulatory Visit: Payer: BC Managed Care – PPO | Admitting: Sports Medicine

## 2021-10-29 DIAGNOSIS — L03031 Cellulitis of right toe: Secondary | ICD-10-CM | POA: Insufficient documentation

## 2021-10-29 DIAGNOSIS — L03032 Cellulitis of left toe: Secondary | ICD-10-CM | POA: Diagnosis not present

## 2021-10-29 MED ORDER — DOXYCYCLINE HYCLATE 100 MG PO TABS: 100.0000 mg | ORAL_TABLET | Freq: Two times a day (BID) | ORAL | 0 refills | Status: DC

## 2021-10-29 NOTE — Assessment & Plan Note (Signed)
Paronychia left great lateral toenail. Adding doxycycline, she will do warm compresses. She does have an ingrown toenail medially. We will do our best to hold off on doing nail plate excisions.

## 2021-10-29 NOTE — Progress Notes (Signed)
    Procedures performed today:    None.  Independent interpretation of notes and tests performed by another provider:   None.  Brief History, Exam, Impression, and Recommendations:    Paronychia of great toe of left foot Paronychia left great lateral toenail. Adding doxycycline , she will do warm compresses. She does have an ingrown toenail medially. We will do our best to hold off on doing nail plate excisions.    ____________________________________________ Debby PARAS. Curtis, M.D., ABFM., CAQSM., AME. Primary Care and Sports Medicine [facility]  Adjunct Professor of [facility] Medicine  [employer]  Rest[employer], [employer]

## 2021-11-01 ENCOUNTER — Telehealth: Payer: Self-pay

## 2021-11-01 DIAGNOSIS — L03032 Cellulitis of left toe: Secondary | ICD-10-CM

## 2021-11-01 NOTE — Telephone Encounter (Signed)
Nausea and vomiting is pretty common with doxycycline, it definitely needs to be taken with food.  If she took it with food and still got sick then I can switch her to sulfamethoxazole and trimethoprim.  Splitting and cracking of the nail does not change the management.

## 2021-11-01 NOTE — Telephone Encounter (Signed)
Patient called to report that each dose fo the antibiotic has caused her to vomit. She has stopped taking the medication and is awaiting new directions. She also stated that the nail has cracked /split. Please advise.

## 2021-11-04 MED ORDER — SULFAMETHOXAZOLE-TRIMETHOPRIM 800-160 MG PO TABS: 1.0000 | ORAL_TABLET | Freq: Two times a day (BID) | ORAL | 0 refills | Status: DC

## 2021-11-04 NOTE — Telephone Encounter (Signed)
Sent!

## 2021-11-04 NOTE — Telephone Encounter (Signed)
Let's send in the sulfa please.

## 2021-11-05 ENCOUNTER — Other Ambulatory Visit: Payer: Self-pay | Admitting: Family Medicine

## 2021-11-05 DIAGNOSIS — I10 Essential (primary) hypertension: Secondary | ICD-10-CM

## 2021-11-06 NOTE — Telephone Encounter (Signed)
Patient aware of new Rx  

## 2021-11-11 DIAGNOSIS — R059 Cough, unspecified: Secondary | ICD-10-CM | POA: Diagnosis not present

## 2021-11-11 DIAGNOSIS — R0989 Other specified symptoms and signs involving the circulatory and respiratory systems: Secondary | ICD-10-CM | POA: Diagnosis not present

## 2021-11-11 DIAGNOSIS — B9689 Other specified bacterial agents as the cause of diseases classified elsewhere: Secondary | ICD-10-CM | POA: Diagnosis not present

## 2021-11-11 DIAGNOSIS — J208 Acute bronchitis due to other specified organisms: Secondary | ICD-10-CM | POA: Diagnosis not present

## 2021-12-18 ENCOUNTER — Ambulatory Visit: Payer: BC Managed Care – PPO | Admitting: Family Medicine

## 2021-12-24 ENCOUNTER — Ambulatory Visit: Payer: BC Managed Care – PPO | Admitting: Family Medicine

## 2021-12-24 ENCOUNTER — Encounter: Payer: Self-pay | Admitting: Family Medicine

## 2021-12-24 VITALS — BP 127/84 | HR 91 | Wt 140.0 lb

## 2021-12-24 DIAGNOSIS — I10 Essential (primary) hypertension: Secondary | ICD-10-CM

## 2021-12-24 NOTE — Progress Notes (Signed)
   Established Patient Office Visit  Subjective   Patient ID: Tina Pearson, female    DOB: 05/27/1980  Age: 41 y.o. MRN: [id number]  Chief Complaint  Patient presents with   Hypertension    HPI  6 mo f/u   Hypertension- Pt denies chest pain, SOB, dizziness, or heart palpitations.  Taking meds as directed w/o problems.  Denies medication side effects.  Home BPs have been well controlled usually under 120, with a diastolic around 78=82.  She has noticed since being on blood pressure medication that her headaches are less frequent.  She does have a history of migraines.  She is also notes that her night sweats are better but she is not sure if that is related or not but they have been better lately.  Also had her health screening at work and BP was great there.      ROS    Objective:     BP 127/84   Pulse 91   Wt 140 lb (63.5 kg)   SpO2 97%   BMI 24.80 kg/m    Physical Exam Vitals and nursing note reviewed.  Constitutional:      Appearance: She is well-developed.  HENT:     Head: Normocephalic and atraumatic.  Cardiovascular:     Rate and Rhythm: Normal rate and regular rhythm.     Heart sounds: Normal heart sounds.  Pulmonary:     Effort: Pulmonary effort is normal.     Breath sounds: Normal breath sounds.  Skin:    General: Skin is warm and dry.  Neurological:     Mental Status: She is alert and oriented to person, place, and time.  Psychiatric:        Behavior: Behavior normal.     No results found for any visits on 12/24/21.    The 10-year ASCVD risk score (Arnett DK, et al., 2019) is: 1.1%    Assessment & Plan:   Problem List Items Addressed This Visit       Cardiovascular and Mediastinum   Essential hypertension - Primary    Blood pressures have been at goal.  We will plan to recheck 1 more time before she leaves today just to see if it is a little bit better.  She does not need any refills today otherwise plan to follow-up in January for her  physical and updated labs.       Return in about 5 months (around 05/26/2022) for Wellness Exam.    Nani Gasser, MD

## 2021-12-24 NOTE — Assessment & Plan Note (Signed)
Blood pressures have been at goal.  We will plan to recheck 1 more time before she leaves today just to see if it is a little bit better.  She does not need any refills today otherwise plan to follow-up in January for her physical and updated labs.

## 2022-01-26 ENCOUNTER — Other Ambulatory Visit: Payer: Self-pay | Admitting: Family Medicine

## 2022-01-26 DIAGNOSIS — I10 Essential (primary) hypertension: Secondary | ICD-10-CM

## 2022-02-12 DIAGNOSIS — E063 Autoimmune thyroiditis: Secondary | ICD-10-CM | POA: Diagnosis not present

## 2022-02-12 DIAGNOSIS — E041 Nontoxic single thyroid nodule: Secondary | ICD-10-CM | POA: Diagnosis not present

## 2022-02-16 ENCOUNTER — Other Ambulatory Visit: Payer: Self-pay | Admitting: Family Medicine

## 2022-04-02 ENCOUNTER — Telehealth (INDEPENDENT_AMBULATORY_CARE_PROVIDER_SITE_OTHER): Payer: BC Managed Care – PPO | Admitting: Family Medicine

## 2022-04-02 DIAGNOSIS — U071 COVID-19: Secondary | ICD-10-CM

## 2022-04-02 MED ORDER — HYDROCODONE BIT-HOMATROP MBR 5-1.5 MG/5ML PO SOLN: 5.0000 mL | Freq: Three times a day (TID) | ORAL | 0 refills | Status: DC | PRN

## 2022-04-02 MED ORDER — NIRMATRELVIR/RITONAVIR (PAXLOVID)TABLET: 3.0000 | ORAL_TABLET | Freq: Two times a day (BID) | ORAL | 0 refills | Status: AC

## 2022-04-02 NOTE — Progress Notes (Signed)
    Virtual Visit via Video Note  I connected with Tina Pearson on 04/02/22 at 11:30 AM EST by a video enabled telemedicine application and verified that I am speaking with the correct person using two identifiers.   I discussed the limitations of evaluation and management by telemedicine and the availability of in person appointments. The patient expressed understanding and agreed to proceed.  Patient location: at home Provider location: in office  Subjective:    CC:  No chief complaint on file.   HPI: Started feeling bad overnight on Friday night.  Had fever 100.6 and cough.  + sick contacts. Tested pos on Monday AM. Burning on right side of throat and nose. Can't smell or taste. Legs feel really well, heavy. Chills have gone away. Trying to stay hydrated. Congestion and HA started Monday as well.  Cough comes and goes. Cough is more dry and burning in her chest.  No SOB. Dayquil and Nyquil.    Past medical history, Surgical history, Family history not pertinant except as noted below, Social history, Allergies, and medications have been entered into the medical record, reviewed, and corrections made.    Objective:    General: Speaking clearly in complete sentences without any shortness of breath.  Alert and oriented x3.  Normal judgment. No apparent acute distress.    Impression and Recommendations:    Problem List Items Addressed This Visit   None Visit Diagnoses     COVID-19    -  Primary   Relevant Medications   nirmatrelvir/ritonavir EUA (PAXLOVID) 20 x 150 MG & 10 x 100MG  TABS      COVID 19   - discussed options. She is out of the optimal window for paxlovid but her symptoms are more moderate to severe. Continue symptomatic care. Stay hydrated. Keep  moving!  Cough syrup sent.   No orders of the defined types were placed in this encounter.   Meds ordered this encounter  Medications   nirmatrelvir/ritonavir EUA (PAXLOVID) 20 x 150 MG & 10 x 100MG  TABS    Sig: Take  3 tablets by mouth 2 (two) times daily for 5 days. (Take nirmatrelvir 150 mg two tablets twice daily for 5 days and ritonavir 100 mg one tablet twice daily for 5 days) Patient GFR is 74    Dispense:  30 tablet    Refill:  0   HYDROcodone bit-homatropine (HYCODAN) 5-1.5 MG/5ML syrup    Sig: Take 5-10 mLs by mouth every 8 (eight) hours as needed for cough.    Dispense:  60 mL    Refill:  0     I discussed the assessment and treatment plan with the patient. The patient was provided an opportunity to ask questions and all were answered. The patient agreed with the plan and demonstrated an understanding of the instructions.   The patient was advised to call back or seek an in-person evaluation if the symptoms worsen or if the condition fails to improve as anticipated.    , MD

## 2022-04-03 ENCOUNTER — Encounter: Payer: Self-pay | Admitting: Family Medicine

## 2022-04-30 ENCOUNTER — Other Ambulatory Visit: Payer: Self-pay | Admitting: Family Medicine

## 2022-04-30 DIAGNOSIS — I10 Essential (primary) hypertension: Secondary | ICD-10-CM

## 2022-05-13 ENCOUNTER — Ambulatory Visit (INDEPENDENT_AMBULATORY_CARE_PROVIDER_SITE_OTHER): Payer: BC Managed Care – PPO | Admitting: Family Medicine

## 2022-05-13 ENCOUNTER — Encounter: Payer: Self-pay | Admitting: Family Medicine

## 2022-05-13 VITALS — BP 128/85 | HR 75 | Ht 63.0 in | Wt 139.0 lb

## 2022-05-13 DIAGNOSIS — E049 Nontoxic goiter, unspecified: Secondary | ICD-10-CM

## 2022-05-13 DIAGNOSIS — Z0000 Encounter for general adult medical examination without abnormal findings: Secondary | ICD-10-CM

## 2022-05-13 NOTE — Progress Notes (Signed)
Complete physical exam  Patient: Tina Pearson   DOB: Feb 19, 1981   42 y.o. Female  MRN: [id number]  Subjective:    Chief Complaint  Patient presents with   Annual Exam    Tina Pearson is a 42 y.o. female who presents today for a complete physical exam. She reports consuming a general diet.  Still some exercise  She generally feels well. She reports sleeping fairly well. She does not have additional problems to discuss today.   She did just want to mention that for about the last year her right ear has been much more sensitive to noise something is really loud a very high-pitched she will actually get some temporary ringing in her ear goes away pretty quickly is not painful just uncomfortable.  Most recent fall risk assessment:    05/13/2022    8:33 AM  Fall Risk   Falls in the past year? 0  Number falls in past yr: 0  Injury with Fall? 0  Risk for fall due to : No Fall Risks  Follow up Falls evaluation completed     Most recent depression screenings:    08/19/2021    8:16 AM 07/12/2021   10:07 AM  PHQ 2/9 Scores  PHQ - 2 Score 0 0        Patient Care Team: Agapito Games, MD as PCP - General (Family Medicine) Warden Fillers, MD (Endocrinology)   Outpatient Medications Prior to Visit  Medication Sig   atorvastatin (LIPITOR) 20 MG tablet TAKE 1 TABLET AT BEDTIME   diazepam (VALIUM) 5 MG tablet Take 1 tablet (5 mg total) by mouth every 8 (eight) hours as needed (vertigo/dizziness).   EPINEPHrine 0.3 mg/0.3 mL IJ SOAJ injection Inject into the skin.   frovatriptan (FROVA) 2.5 MG tablet TAKE 1 TABLET DAILY AS NEEDED FOR MIGRAINE. IF RECURS, MAY REPEAT AFTER 2 HOURS. MAX OF 3 TABLETS IN 24 HOURS.   levonorgestrel-ethinyl estradiol (SEASONALE) 0.15-0.03 MG tablet Take 1 tablet by mouth daily.   losartan (COZAAR) 25 MG tablet TAKE 1 TABLET DAILY   meclizine (ANTIVERT) 25 MG tablet Take 1 tablet (25 mg total) by mouth 3 (three) times daily as needed for dizziness or nausea.    [DISCONTINUED] HYDROcodone bit-homatropine (HYCODAN) 5-1.5 MG/5ML syrup Take 5-10 mLs by mouth every 8 (eight) hours as needed for cough.   No facility-administered medications prior to visit.    ROS        Objective:     BP (!) 131/91   Pulse 81   Ht 5\' 3"  (1.6 m)   Wt 139 lb (63 kg)   SpO2 99%   BMI 24.62 kg/m    Physical Exam Vitals and nursing note reviewed.  Constitutional:      Appearance: She is well-developed.  HENT:     Head: Normocephalic and atraumatic.     Right Ear: Tympanic membrane, ear canal and external ear normal.     Left Ear: Tympanic membrane, ear canal and external ear normal.     Nose: Nose normal.     Mouth/Throat:     Pharynx: Oropharynx is clear.  Eyes:     Conjunctiva/sclera: Conjunctivae normal.     Pupils: Pupils are equal, round, and reactive to light.  Neck:     Thyroid: No thyromegaly.  Cardiovascular:     Rate and Rhythm: Normal rate and regular rhythm.     Heart sounds: Normal heart sounds.  Pulmonary:     Effort: Pulmonary effort is normal.  Breath sounds: Normal breath sounds. No wheezing.  Abdominal:     General: Bowel sounds are normal.     Palpations: Abdomen is soft.  Musculoskeletal:     Cervical back: Neck supple.  Lymphadenopathy:     Cervical: No cervical adenopathy.  Skin:    General: Skin is warm and dry.  Neurological:     Mental Status: She is alert and oriented to person, place, and time.  Psychiatric:        Behavior: Behavior normal.      No results found for any visits on 05/13/22.     Assessment & Plan:    Routine Health Maintenance and Physical Exam  Immunization History  Administered Date(s) Administered   Influenza Inj Mdck Quad Pf 01/18/2019   Influenza,inj,Quad PF,6+ Mos 02/13/2016, 02/01/2022   Influenza-Unspecified 03/05/2015, 01/03/2017, 02/05/2017, 02/24/2018, 01/18/2019, 01/20/2020, 02/14/2021   PFIZER(Purple Top)SARS-COV-2 Vaccination 08/11/2019, 09/08/2019, 02/12/2020   Td  12/28/2003   Tdap 05/06/2003, 05/27/2012, 01/09/2017    Health Maintenance  Topic Date Due   Hepatitis C Screening  05/14/2023 (Originally 07/23/1998)   COVID-19 Vaccine (4 - 2023-24 season) 05/30/2023 (Originally 01/03/2022)   MAMMOGRAM  09/11/2023   PAP SMEAR-Modifier  05/01/2025   DTaP/Tdap/Td (5 - Td or Tdap) 01/10/2027   INFLUENZA VACCINE  Completed   HIV Screening  Completed   Pneumococcal Vaccine 52-57 Years old  Aged Out   HPV VACCINES  Aged Out    Discussed health benefits of physical activity, and encouraged her to engage in regular exercise appropriate for her age and condition.  Problem List Items Addressed This Visit       Endocrine   Goiter   Relevant Orders   TSH   Other Visit Diagnoses     Wellness examination    -  Primary   Relevant Orders   Lipid Panel w/reflex Direct LDL   COMPLETE METABOLIC PANEL WITH GFR   CBC   TSH     Keep up a regular exercise program and make sure you are eating a healthy diet Try to eat 4 servings of dairy a day, or if you are lactose intolerant take a calcium with vitamin D daily.  Your vaccines are up to date.   No follow-ups on file.     Beatrice Lecher, MD

## 2022-05-14 LAB — COMPLETE METABOLIC PANEL WITH GFR
AG Ratio: 1.5 (calc) (ref 1.0–2.5)
ALT: 15 U/L (ref 6–29)
AST: 16 U/L (ref 10–30)
Albumin: 4.6 g/dL (ref 3.6–5.1)
Alkaline phosphatase (APISO): 78 U/L (ref 31–125)
BUN: 11 mg/dL (ref 7–25)
CO2: 24 mmol/L (ref 20–32)
Calcium: 9.5 mg/dL (ref 8.6–10.2)
Chloride: 105 mmol/L (ref 98–110)
Creat: 0.84 mg/dL (ref 0.50–0.99)
Globulin: 3.1 g/dL (calc) (ref 1.9–3.7)
Glucose, Bld: 73 mg/dL (ref 65–99)
Potassium: 4.2 mmol/L (ref 3.5–5.3)
Sodium: 141 mmol/L (ref 135–146)
Total Bilirubin: 0.6 mg/dL (ref 0.2–1.2)
Total Protein: 7.7 g/dL (ref 6.1–8.1)
eGFR: 89 mL/min/{1.73_m2} (ref >=60)

## 2022-05-14 LAB — CBC
HCT: 43.6 % (ref 35.0–45.0)
Hemoglobin: 14.6 g/dL (ref 11.7–15.5)
MCH: 30.1 pg (ref 27.0–33.0)
MCHC: 33.5 g/dL (ref 32.0–36.0)
MCV: 89.9 fL (ref 80.0–100.0)
MPV: 12.3 fL (ref 7.5–12.5)
Platelets: 257 10*3/uL (ref 140–400)
RBC: 4.85 10*6/uL (ref 3.80–5.10)
RDW: 12.7 % (ref 11.0–15.0)
WBC: 5.6 10*3/uL (ref 3.8–10.8)

## 2022-05-14 LAB — LIPID PANEL W/REFLEX DIRECT LDL
Cholesterol: 187 mg/dL (ref <200)
HDL: 45 mg/dL — ABNORMAL LOW (ref >=50)
LDL Cholesterol (Calc): 115 mg/dL (calc) — ABNORMAL HIGH
Non-HDL Cholesterol (Calc): 142 mg/dL (calc) — ABNORMAL HIGH (ref <130)
Total CHOL/HDL Ratio: 4.2 (calc) (ref <5.0)
Triglycerides: 158 mg/dL — ABNORMAL HIGH (ref <150)

## 2022-05-14 LAB — TSH: TSH: 2.72 mIU/L

## 2022-05-14 NOTE — Progress Notes (Signed)
Hi Tina Pearson, your LDL cholesterol has trended up the last couple years just slightly nothing major but just really encouraged her to continue to work on [employer].  Eat more vegetables.  Your metabolic panel including liver and kidney function looks great.  Your blood count is normal no sign of anemia.  Thyroid looks great.

## 2022-07-15 ENCOUNTER — Other Ambulatory Visit: Payer: Self-pay | Admitting: Family Medicine

## 2022-07-15 DIAGNOSIS — N920 Excessive and frequent menstruation with regular cycle: Secondary | ICD-10-CM

## 2022-09-15 ENCOUNTER — Ambulatory Visit: Payer: BC Managed Care – PPO | Admitting: Family Medicine

## 2022-09-15 VITALS — BP 130/84 | HR 102 | Temp 99.0°F | Ht 63.0 in | Wt 135.0 lb

## 2022-09-15 DIAGNOSIS — J101 Influenza due to other identified influenza virus with other respiratory manifestations: Secondary | ICD-10-CM | POA: Diagnosis not present

## 2022-09-15 DIAGNOSIS — R059 Cough, unspecified: Secondary | ICD-10-CM | POA: Diagnosis not present

## 2022-09-15 LAB — POC COVID19 BINAXNOW: SARS Coronavirus 2 Ag: NEGATIVE

## 2022-09-15 LAB — POCT INFLUENZA A/B
Influenza A, POC: NEGATIVE
Influenza B, POC: POSITIVE — AB

## 2022-09-15 MED ORDER — OSELTAMIVIR PHOSPHATE 75 MG PO CAPS: 75.0000 mg | ORAL_CAPSULE | Freq: Two times a day (BID) | ORAL | 0 refills | Status: DC

## 2022-09-15 NOTE — Progress Notes (Signed)
Acute Office Visit  Subjective:     Patient ID: Tina Pearson, female    DOB: January 14, 1981, 42 y.o.   MRN: [id number]  Chief Complaint  Patient presents with   Cough    X1.5 wks    HPI Patient is in today for cough x 1-1/2 weeks.  She says the cough has been mostly dry but occasionally productive.  She did have some old hydrocodone cough syrup which she took for couple days and did get up a little bit of mucus.  No fevers chills or sweats but then this began she started having more muscle aches she did do a COVID test this weekend and it was negative.  She has noticed some occasional dark yellow sputum.  She has not had any sinus symptoms or ear pain or sore throat.  And then today she has started running a low-grade fever.  ROS      Objective:    BP 130/84   Pulse (!) 102   Temp 99 F (37.2 C)   Ht 5\' 3"  (1.6 m)   Wt 135 lb (61.2 kg)   SpO2 98%   BMI 23.91 kg/m    Physical Exam Constitutional:      Appearance: She is well-developed.  HENT:     Head: Normocephalic and atraumatic.     Right Ear: External ear normal.     Left Ear: External ear normal.     Nose: Nose normal.  Eyes:     Conjunctiva/sclera: Conjunctivae normal.     Pupils: Pupils are equal, round, and reactive to light.  Neck:     Thyroid: No thyromegaly.     Comments: +thyromegaly Cardiovascular:     Rate and Rhythm: Normal rate and regular rhythm.     Heart sounds: Normal heart sounds.  Pulmonary:     Effort: Pulmonary effort is normal.     Breath sounds: Normal breath sounds. No wheezing.  Musculoskeletal:     Cervical back: Neck supple.  Lymphadenopathy:     Cervical: No cervical adenopathy.  Skin:    General: Skin is warm and dry.  Neurological:     Mental Status: She is alert and oriented to person, place, and time.     Results for orders placed or performed in visit on 09/15/22  POCT Influenza A/B  Result Value Ref Range   Influenza A, POC Negative Negative   Influenza B, POC  Positive (A) Negative  POC COVID-19  Result Value Ref Range   SARS Coronavirus 2 Ag Negative Negative        Assessment & Plan:   Problem List Items Addressed This Visit   None Visit Diagnoses     Cough, unspecified type    -  Primary   Relevant Orders   POCT Influenza A/B (Completed)   POC COVID-19 (Completed)   Influenza B       Relevant Medications   oseltamivir (TAMIFLU) 75 MG capsule      For COVID but positive for influenza B.  I feel like she likely had an initial respiratory illness that started a week and a half ago with new onset body aches and low-grade fever in the last day or 2 feel like the flu is probably secondary.  Will treat with Tamiflu.  Okay to continue symptomatic treatment for cough.  Make sure staying well-hydrated.  If not improving over the next few days then please let us know.  She does have a prior history of pneumonia but lung  exam is clear today.  Meds ordered this encounter  Medications   oseltamivir (TAMIFLU) 75 MG capsule    Sig: Take 1 capsule (75 mg total) by mouth 2 (two) times daily.    Dispense:  10 capsule    Refill:  0    No follow-ups on file.  Nani Gasser, MD

## 2022-09-19 ENCOUNTER — Encounter: Payer: Self-pay | Admitting: Family Medicine

## 2022-09-19 ENCOUNTER — Other Ambulatory Visit: Payer: Self-pay | Admitting: *Deleted

## 2022-09-19 ENCOUNTER — Ambulatory Visit: Payer: BC Managed Care – PPO | Admitting: Family Medicine

## 2022-09-19 ENCOUNTER — Ambulatory Visit (INDEPENDENT_AMBULATORY_CARE_PROVIDER_SITE_OTHER): Payer: BC Managed Care – PPO

## 2022-09-19 VITALS — BP 148/95 | HR 100 | Temp 98.9°F | Ht 65.0 in | Wt 135.0 lb

## 2022-09-19 DIAGNOSIS — R0989 Other specified symptoms and signs involving the circulatory and respiratory systems: Secondary | ICD-10-CM | POA: Diagnosis not present

## 2022-09-19 DIAGNOSIS — J101 Influenza due to other identified influenza virus with other respiratory manifestations: Secondary | ICD-10-CM

## 2022-09-19 DIAGNOSIS — R059 Cough, unspecified: Secondary | ICD-10-CM | POA: Diagnosis not present

## 2022-09-19 LAB — CBC WITH DIFFERENTIAL/PLATELET
Absolute Monocytes: 499 cells/uL (ref 200–950)
Basophils Absolute: 38 cells/uL (ref 0–200)
Basophils Relative: 0.6 %
Eosinophils Absolute: 166 cells/uL (ref 15–500)
Eosinophils Relative: 2.6 %
HCT: 42 % (ref 35.0–45.0)
Hemoglobin: 14.2 g/dL (ref 11.7–15.5)
Lymphs Abs: 1376 cells/uL (ref 850–3900)
MCH: 29.8 pg (ref 27.0–33.0)
MCHC: 33.8 g/dL (ref 32.0–36.0)
MCV: 88.1 fL (ref 80.0–100.0)
MPV: 12.5 fL (ref 7.5–12.5)
Monocytes Relative: 7.8 %
Neutro Abs: 4320 cells/uL (ref 1500–7800)
Neutrophils Relative %: 67.5 %
Platelets: 265 10*3/uL (ref 140–400)
RBC: 4.77 10*6/uL (ref 3.80–5.10)
RDW: 12.1 % (ref 11.0–15.0)
Total Lymphocyte: 21.5 %
WBC: 6.4 10*3/uL (ref 3.8–10.8)

## 2022-09-19 MED ORDER — PANTOPRAZOLE SODIUM 40 MG PO TBEC: 40.0000 mg | DELAYED_RELEASE_TABLET | Freq: Every day | ORAL | 0 refills | Status: DC

## 2022-09-19 MED ORDER — GUAIFENESIN-CODEINE 100-10 MG/5ML PO SOLN: 5.0000 mL | Freq: Three times a day (TID) | ORAL | 0 refills | Status: DC | PRN

## 2022-09-19 MED ORDER — PANTOPRAZOLE SODIUM 40 MG PO TBEC
40.0000 mg | DELAYED_RELEASE_TABLET | Freq: Every day | ORAL | 0 refills | Status: DC
Start: 2022-09-19 — End: 2022-09-19

## 2022-09-19 MED ORDER — PREDNISONE 20 MG PO TABS
40.0000 mg | ORAL_TABLET | Freq: Every day | ORAL | 0 refills | Status: DC
Start: 2022-09-19 — End: 2023-04-01

## 2022-09-19 NOTE — Progress Notes (Signed)
Acute Office Visit  Subjective:     Patient ID: Tina Pearson, female    DOB: 1980-09-21, 42 y.o.   MRN: [id number]  Chief Complaint  Patient presents with   Cough   chest congestion    HPI Patient is in today for cough. Seen 4 days ago as her sxs had worsened.  Tested pos for the flu. Tamiflu started. CXR this AM negative. Cough is more dry now.  Feels like coming from upper chest but now getting burning in midchest.  Today started getting sneezing and runny nose.    ROS      Objective:    BP (!) 148/95   Pulse 100   Temp 98.9 F (37.2 C)   Ht 5\' 5"  (1.651 m)   Wt 135 lb (61.2 kg)   SpO2 98%   BMI 22.47 kg/m    Physical Exam Constitutional:      Appearance: She is well-developed.  HENT:     Head: Normocephalic and atraumatic.     Right Ear: External ear normal.     Left Ear: External ear normal.     Nose: Nose normal.  Eyes:     Conjunctiva/sclera: Conjunctivae normal.     Pupils: Pupils are equal, round, and reactive to light.  Neck:     Thyroid: No thyromegaly.  Cardiovascular:     Rate and Rhythm: Normal rate and regular rhythm.     Heart sounds: Normal heart sounds.  Pulmonary:     Effort: Pulmonary effort is normal.     Breath sounds: Normal breath sounds. No wheezing.  Musculoskeletal:     Cervical back: Neck supple.  Lymphadenopathy:     Cervical: No cervical adenopathy.  Skin:    General: Skin is warm and dry.  Neurological:     Mental Status: She is alert and oriented to person, place, and time.    No results found for any visits on 09/19/22.      Assessment & Plan:   Problem List Items Addressed This Visit   None Visit Diagnoses     Influenza B    -  Primary   Cough, unspecified type       Relevant Orders   CBC with Differential/Platelet   Bordetella Pertussis PCR      CXR is neg.  Change cough syrup to codeine.  Add PPI and prednisone.  Test for pertussis. Cough has been present for little over 2 weeks at this point and is  forceful and frequent Finish Tamiflu  Meds ordered this encounter  Medications   DISCONTD: pantoprazole (PROTONIX) 40 MG tablet    Sig: Take 1 tablet (40 mg total) by mouth daily.    Dispense:  30 tablet    Refill:  0   DISCONTD: guaiFENesin-codeine 100-10 MG/5ML syrup    Sig: Take 5-10 mLs by mouth 3 (three) times daily as needed for cough.    Dispense:  120 mL    Refill:  0   predniSONE (DELTASONE) 20 MG tablet    Sig: Take 2 tablets (40 mg total) by mouth daily with breakfast.    Dispense:  10 tablet    Refill:  0   guaiFENesin-codeine 100-10 MG/5ML syrup    Sig: Take 5-10 mLs by mouth 3 (three) times daily as needed for cough.    Dispense:  120 mL    Refill:  0   pantoprazole (PROTONIX) 40 MG tablet    Sig: Take 1 tablet (40 mg total) by mouth  daily.    Dispense:  30 tablet    Refill:  0    No follow-ups on file.  Nani Gasser, MD

## 2022-09-19 NOTE — Patient Instructions (Signed)
Change cough syrup to codeine.  Add PPI and prednisone.  Test for pertussis. Finish Tamiflu

## 2022-09-22 NOTE — Progress Notes (Signed)
Your lab work is within acceptable range and there are no concerning findings.   ?

## 2022-09-23 LAB — BORDETELLA PERTUSSIS PCR
B. PERTUSSIS DNA: NOT DETECTED
B. parapertussis DNA: NOT DETECTED

## 2022-09-24 NOTE — Progress Notes (Signed)
Tina Pearson, test came back negative for pertussis.  How are you feeling?  Do you feel like your cough is getting at least somewhat better with some of the changes that we made.

## 2022-10-05 ENCOUNTER — Other Ambulatory Visit: Payer: Self-pay | Admitting: Family Medicine

## 2022-11-22 DIAGNOSIS — Z1231 Encounter for screening mammogram for malignant neoplasm of breast: Secondary | ICD-10-CM | POA: Diagnosis not present

## 2022-11-22 DIAGNOSIS — R92333 Mammographic heterogeneous density, bilateral breasts: Secondary | ICD-10-CM | POA: Diagnosis not present

## 2022-12-04 DIAGNOSIS — L820 Inflamed seborrheic keratosis: Secondary | ICD-10-CM | POA: Diagnosis not present

## 2023-02-10 ENCOUNTER — Ambulatory Visit: Payer: BC Managed Care – PPO | Admitting: Sports Medicine

## 2023-02-10 ENCOUNTER — Encounter: Payer: Self-pay | Admitting: Sports Medicine

## 2023-02-10 DIAGNOSIS — E041 Nontoxic single thyroid nodule: Secondary | ICD-10-CM | POA: Diagnosis not present

## 2023-02-10 DIAGNOSIS — E063 Autoimmune thyroiditis: Secondary | ICD-10-CM | POA: Diagnosis not present

## 2023-02-10 DIAGNOSIS — L603 Nail dystrophy: Secondary | ICD-10-CM

## 2023-02-10 MED ORDER — HYDROCODONE-ACETAMINOPHEN 10-325 MG PO TABS: 1.0000 | ORAL_TABLET | Freq: Three times a day (TID) | ORAL | 0 refills | Status: DC | PRN

## 2023-02-10 NOTE — Assessment & Plan Note (Signed)
Very pleasant 42 year old female, onychodystrophy right third toenail, desires removal, today we performed nail plate removal with phenol matricectomy under digital anesthesia, high-dose hydrocodone for postoperative pain, return to see me in 2 weeks for a wound check.

## 2023-02-10 NOTE — Progress Notes (Signed)
    Procedures performed today:    Procedure:  Removal of right third toenail. Risks, benefits, alternatives explained to patient. Consent obtained. Time out conducted. Noted no overlying induration or erythema at site of injection. Toe cleaned with alcohol, then a total of 10cc lidocaine 2% infiltrated at adjacent webspaces at the location of the bifurcation of the common digital nerve to proper digital nerves.   Adequate anesthesia ensured. Toe prepped and draped in a sterile fashion. Nail elevator used to separate nail plate from nail bed. Hemostat then used to separate nail fragment from surrounding structures. Nail bed and matrix treated. Minor bleeding controlled with pressure and phenol. Antibiotic ointment applied. Toe dressed. Advised to return if increased redness, swelling, drainage, fevers, or chills.  Independent interpretation of notes and tests performed by another provider:   None.  Brief History, Exam, Impression, and Recommendations:    Onychodystrophy right third toe Very pleasant 42 year old female, onychodystrophy right third toenail, desires removal, today we performed nail plate removal with phenol matricectomy under digital anesthesia, high-dose hydrocodone for postoperative pain, return to see me in 2 weeks for a wound check.    ____________________________________________ Ihor Austin. Benjamin Stain, M.D., ABFM., CAQSM., AME. Primary Care and Sports Medicine  [facility]  Adjunct Professor of Family Medicine  [place] [employer]  Restaurant manager, fast food

## 2023-02-24 ENCOUNTER — Ambulatory Visit: Payer: BC Managed Care – PPO | Admitting: Sports Medicine

## 2023-02-24 ENCOUNTER — Encounter: Payer: Self-pay | Admitting: Sports Medicine

## 2023-02-24 DIAGNOSIS — L603 Nail dystrophy: Secondary | ICD-10-CM

## 2023-02-24 NOTE — Progress Notes (Signed)
    Procedures performed today:    None.  Independent interpretation of notes and tests performed by another provider:   None.  Brief History, Exam, Impression, and Recommendations:    Onychodystrophy right third toe Patient returns approximately 2-week status post right third toenail plate excision with phenol matricectomy, there is no evidence of infection, drainage, pain is gone. There is significant dark eschar over the nailbed which is expected, I have suggested that [facility] massage to this daily, I like to see her back 1 more time in 6 weeks once this has healed. Of note for further procedures hydrocodone 10 was insufficient pain relief, I would suggest oxycodone 10 or Dilaudid 4 mg for prior toenail removal postoperative pain.    ____________________________________________ Ihor Austin. Benjamin Stain, M.D., ABFM., CAQSM., AME. Primary Care and Sports Medicine [place] [facility]  Adjunct Professor of Family Medicine  [place] [employer] [facility] [employer] [employer]  Restaurant manager, fast food

## 2023-02-24 NOTE — Assessment & Plan Note (Signed)
Patient returns approximately 2-week status post right third toenail plate excision with phenol matricectomy, there is no evidence of infection, drainage, pain is gone. There is significant dark eschar over the nailbed which is expected, I have suggested that [facility] massage to this daily, I like to see her back 1 more time in 6 weeks once this has healed. Of note for further procedures hydrocodone 10 was insufficient pain relief, I would suggest oxycodone 10 or Dilaudid 4 mg for prior toenail removal postoperative pain.

## 2023-03-25 ENCOUNTER — Other Ambulatory Visit: Payer: Self-pay | Admitting: Family Medicine

## 2023-03-25 DIAGNOSIS — I10 Essential (primary) hypertension: Secondary | ICD-10-CM

## 2023-04-01 ENCOUNTER — Ambulatory Visit: Payer: BC Managed Care – PPO | Admitting: Family Medicine

## 2023-04-01 ENCOUNTER — Encounter: Payer: Self-pay | Admitting: Family Medicine

## 2023-04-01 VITALS — BP 137/87 | HR 88 | Temp 98.7°F | Ht 63.0 in | Wt 142.0 lb

## 2023-04-01 DIAGNOSIS — J22 Unspecified acute lower respiratory infection: Secondary | ICD-10-CM

## 2023-04-01 MED ORDER — AZITHROMYCIN 250 MG PO TABS: ORAL_TABLET | ORAL | 0 refills | Status: AC

## 2023-04-01 NOTE — Progress Notes (Signed)
She reports that her sxs began 2 wks ago. She has been taking dayquil and nyquil. Denies any f/s/c/n. She did inform me that her son has been sick and was diagnosed yesterday with PNE.

## 2023-04-01 NOTE — Progress Notes (Signed)
   Acute Office Visit  Subjective:     Patient ID: Tina Pearson, female    DOB: 1981-01-06, 42 y.o.   MRN: [id number]  Chief Complaint  Patient presents with   Nasal Congestion    HPI Patient is in today for cough and chest congestion.  She is also had a little bit of intermittent nasal congestion and it is usually little worse at night in the morning and then can get a little better during the day.  She is been sick for 2 weeks she has had some chills but every time she tried to check her temperature they would not read.  She denies any shortness of breath or wheezing.  Her son has also been sick for about 2 weeks and he was diagnosed with pneumonia after having had a chest x-ray yesterday.  Using DayQuil and NyQuil.  ROS      Objective:    BP 137/87   Pulse 88   Temp 98.7 F (37.1 C)   Ht 5\' 3"  (1.6 m)   Wt 142 lb (64.4 kg)   SpO2 99%   BMI 25.15 kg/m    Physical Exam Constitutional:      Appearance: Normal appearance.  HENT:     Head: Normocephalic and atraumatic.     Right Ear: Tympanic membrane, ear canal and external ear normal. There is no impacted cerumen.     Left Ear: Tympanic membrane, ear canal and external ear normal. There is no impacted cerumen.     Nose: Nose normal.     Mouth/Throat:     Pharynx: Oropharynx is clear.  Eyes:     Conjunctiva/sclera: Conjunctivae normal.  Cardiovascular:     Rate and Rhythm: Normal rate and regular rhythm.  Pulmonary:     Effort: Pulmonary effort is normal.     Breath sounds: Normal breath sounds.  Musculoskeletal:     Cervical back: Neck supple. No tenderness.  Lymphadenopathy:     Cervical: No cervical adenopathy.  Skin:    General: Skin is warm and dry.  Neurological:     Mental Status: She is alert and oriented to person, place, and time.  Psychiatric:        Mood and Affect: Mood normal.     No results found for any visits on 04/01/23.      Assessment & Plan:   Problem List Items Addressed This  Visit   None Visit Diagnoses     Lower resp. tract infection    -  Primary   Relevant Medications   azithromycin (ZITHROMAX) 250 MG tablet       Lungs are clear on exam today which is very reassuring but since she is been sick for 2 weeks and is not improving him to go ahead and treat her with azithromycin just to cover for mycoplasma since it is here locally in the community.  Call if not better after the weekend.  Okay to continue with symptomatic care.  Meds ordered this encounter  Medications   azithromycin (ZITHROMAX) 250 MG tablet    Sig: 2 Ttabs PO on Day 1, then one a day x 4 days.    Dispense:  6 tablet    Refill:  0    No follow-ups on file.  Nani Gasser, MD

## 2023-04-07 ENCOUNTER — Ambulatory Visit: Payer: BC Managed Care – PPO | Admitting: Sports Medicine

## 2023-05-18 ENCOUNTER — Encounter: Payer: BC Managed Care – PPO | Admitting: Family Medicine

## 2023-05-27 ENCOUNTER — Encounter: Payer: Self-pay | Admitting: Family Medicine

## 2023-05-27 ENCOUNTER — Ambulatory Visit (INDEPENDENT_AMBULATORY_CARE_PROVIDER_SITE_OTHER): Payer: BC Managed Care – PPO | Admitting: Family Medicine

## 2023-05-27 VITALS — BP 131/75 | HR 78 | Ht 63.5 in | Wt 140.0 lb

## 2023-05-27 DIAGNOSIS — Z0000 Encounter for general adult medical examination without abnormal findings: Secondary | ICD-10-CM

## 2023-05-27 DIAGNOSIS — E7800 Pure hypercholesterolemia, unspecified: Secondary | ICD-10-CM

## 2023-05-27 DIAGNOSIS — Z1159 Encounter for screening for other viral diseases: Secondary | ICD-10-CM | POA: Diagnosis not present

## 2023-05-27 DIAGNOSIS — E049 Nontoxic goiter, unspecified: Secondary | ICD-10-CM | POA: Diagnosis not present

## 2023-05-27 MED ORDER — ATORVASTATIN CALCIUM 20 MG PO TABS: 20.0000 mg | ORAL_TABLET | Freq: Every day | ORAL | 3 refills | Status: AC

## 2023-05-27 NOTE — Progress Notes (Signed)
Complete physical exam  Patient: Tina Pearson   DOB: [birth year]   43 y.o. Female  MRN: [id number]  Subjective:    Chief Complaint  Patient presents with   Annual Exam    Requesting lab work today as well    Tina Pearson is a 43 y.o. female who presents today for a complete physical exam. She reports consuming a general diet.  Exercises   She generally feels well. She reports sleeping fairly well, occ restless. She does not have additional problems to discuss today.    Most recent fall risk assessment:    05/27/2023    8:58 AM  Fall Risk   Falls in the past year? 0  Number falls in past yr: 0  Injury with Fall? 0  Risk for fall due to : No Fall Risks  Follow up Falls evaluation completed     Most recent depression screenings:    05/27/2023    8:58 AM 09/15/2022   11:11 AM  PHQ 2/9 Scores  PHQ - 2 Score 0 0         Patient Care Team: Agapito Games, MD as PCP - General (Family Medicine) Warden Fillers, MD (Endocrinology)   Outpatient Medications Prior to Visit  Medication Sig   EPINEPHrine 0.3 mg/0.3 mL IJ SOAJ injection Inject into the skin.   frovatriptan (FROVA) 2.5 MG tablet TAKE 1 TABLET DAILY AS NEEDED FOR MIGRAINE. IF RECURS, MAY REPEAT AFTER 2 HOURS. MAX OF 3 TABLETS IN 24 HOURS.   levonorgestrel-ethinyl estradiol (SEASONALE) 0.15-0.03 MG tablet TAKE 1 TABLET DAILY   losartan (COZAAR) 25 MG tablet TAKE 1 TABLET DAILY   [DISCONTINUED] atorvastatin (LIPITOR) 20 MG tablet TAKE 1 TABLET AT BEDTIME   [DISCONTINUED] pantoprazole (PROTONIX) 40 MG tablet Take 1 tablet (40 mg total) by mouth daily. (Patient not taking: Reported on 05/27/2023)   No facility-administered medications prior to visit.    ROS        Objective:     BP 131/75   Pulse 78   Ht 5' 3.5" (1.613 m)   Wt 140 lb (63.5 kg)   SpO2 100%   BMI 24.41 kg/m     Physical Exam Constitutional:      Appearance: Normal appearance.  HENT:     Head: Normocephalic and atraumatic.      Right Ear: Tympanic membrane, ear canal and external ear normal.     Left Ear: Tympanic membrane, ear canal and external ear normal.     Nose: Nose normal.     Mouth/Throat:     Pharynx: Oropharynx is clear.  Eyes:     Extraocular Movements: Extraocular movements intact.     Conjunctiva/sclera: Conjunctivae normal.     Pupils: Pupils are equal, round, and reactive to light.  Neck:     Thyroid: No thyromegaly.  Cardiovascular:     Rate and Rhythm: Normal rate and regular rhythm.  Pulmonary:     Effort: Pulmonary effort is normal.     Breath sounds: Normal breath sounds.  Abdominal:     General: Bowel sounds are normal.     Palpations: Abdomen is soft.     Tenderness: There is no abdominal tenderness.  Musculoskeletal:        General: No swelling.     Cervical back: Neck supple.  Skin:    General: Skin is warm and dry.  Neurological:     Mental Status: She is oriented to person, place, and time.  Psychiatric:  Mood and Affect: Mood normal.        Behavior: Behavior normal.      No results found for any visits on 05/27/23.      Assessment & Plan:    Routine Health Maintenance and Physical Exam  Immunization History  Administered Date(s) Administered   Influenza Inj Mdck Quad Pf 01/18/2019   Influenza, Seasonal, Injecte, Preservative Fre 02/01/2023   Influenza,inj,Quad PF,6+ Mos 02/13/2016, 02/01/2022   Influenza-Unspecified 03/05/2015, 01/03/2017, 02/05/2017, 02/24/2018, 01/18/2019, 01/20/2020, 02/14/2021   PFIZER(Purple Top)SARS-COV-2 Vaccination 08/11/2019, 09/08/2019, 02/12/2020   Td 12/28/2003   Tdap 05/06/2003, 05/27/2012, 01/09/2017    Health Maintenance  Topic Date Due   Hepatitis C Screening  Never done   COVID-19 Vaccine (4 - 2024-25 season) 06/12/2023 (Originally 01/04/2023)   MAMMOGRAM  09/11/2023   Cervical Cancer Screening (HPV/Pap Cotest)  05/01/2025   DTaP/Tdap/Td (5 - Td or Tdap) 01/10/2027   INFLUENZA VACCINE  Completed   HIV Screening   Completed   HPV VACCINES  Aged Out    Discussed health benefits of physical activity, and encouraged her to engage in regular exercise appropriate for her age and condition.  Problem List Items Addressed This Visit       Endocrine   Goiter   Relevant Orders   TSH     Other   PURE HYPERCHOLESTEROLEMIA   Relevant Medications   atorvastatin (LIPITOR) 20 MG tablet   Other Visit Diagnoses       Wellness examination    -  Primary   Relevant Orders   CMP14+EGFR   Lipid panel   CBC   TSH   Hepatitis C Antibody     Encounter for hepatitis C screening test for low risk patient       Relevant Orders   Hepatitis C Antibody     Keep up a regular exercise program and make sure you are eating a healthy diet Try to eat 4 servings of dairy a day, or if you are lactose intolerant take a calcium with vitamin D daily.  Your vaccines are up to date.   Return in about 6 months (around 11/24/2023) for Hypertension.     Nani Gasser, MD

## 2023-05-28 LAB — LIPID PANEL
Chol/HDL Ratio: 4.5 {ratio} — ABNORMAL HIGH (ref 0.0–4.4)
Cholesterol, Total: 181 mg/dL (ref 100–199)
HDL: 40 mg/dL (ref >39)
LDL Chol Calc (NIH): 118 mg/dL — ABNORMAL HIGH (ref 0–99)
Triglycerides: 130 mg/dL (ref 0–149)
VLDL Cholesterol Cal: 23 mg/dL (ref 5–40)

## 2023-05-28 LAB — CMP14+EGFR
ALT: 10 [IU]/L (ref 0–32)
AST: 15 [IU]/L (ref 0–40)
Albumin: 4.5 g/dL (ref 3.9–4.9)
Alkaline Phosphatase: 110 [IU]/L (ref 44–121)
BUN/Creatinine Ratio: 12 (ref 9–23)
BUN: 11 mg/dL (ref 6–24)
Bilirubin Total: 0.6 mg/dL (ref 0.0–1.2)
CO2: 21 mmol/L (ref 20–29)
Calcium: 9.4 mg/dL (ref 8.7–10.2)
Chloride: 103 mmol/L (ref 96–106)
Creatinine, Ser: 0.9 mg/dL (ref 0.57–1.00)
Globulin, Total: 2.9 g/dL (ref 1.5–4.5)
Glucose: 87 mg/dL (ref 70–99)
Potassium: 4.3 mmol/L (ref 3.5–5.2)
Sodium: 139 mmol/L (ref 134–144)
Total Protein: 7.4 g/dL (ref 6.0–8.5)
eGFR: 82 mL/min/{1.73_m2} (ref >59)

## 2023-05-28 LAB — CBC
Hematocrit: 43.9 % (ref 34.0–46.6)
Hemoglobin: 14.9 g/dL (ref 11.1–15.9)
MCH: 30.7 pg (ref 26.6–33.0)
MCHC: 33.9 g/dL (ref 31.5–35.7)
MCV: 90 fL (ref 79–97)
Platelets: 264 10*3/uL (ref 150–450)
RBC: 4.86 x10E6/uL (ref 3.77–5.28)
RDW: 12.2 % (ref 11.7–15.4)
WBC: 7.3 10*3/uL (ref 3.4–10.8)

## 2023-05-28 LAB — TSH: TSH: 1.7 u[IU]/mL (ref 0.450–4.500)

## 2023-05-28 LAB — HEPATITIS C ANTIBODY: Hep C Virus Ab: NONREACTIVE

## 2023-06-01 ENCOUNTER — Encounter: Payer: Self-pay | Admitting: Family Medicine

## 2023-06-01 NOTE — Progress Notes (Signed)
Hi Tina Pearson, LDL cholesterol just mildly elevated continue to work on [employer] and regular exercise.  Triglycerides look good this time.  Blood count and metabolic panel look great.  Thyroid is perfect.  Negative for hepatitis C.

## 2023-06-02 ENCOUNTER — Encounter: Payer: Self-pay | Admitting: Family Medicine

## 2023-06-05 ENCOUNTER — Encounter: Payer: Self-pay | Admitting: Family Medicine

## 2023-07-07 ENCOUNTER — Encounter: Payer: Self-pay | Admitting: Sports Medicine

## 2023-07-07 ENCOUNTER — Ambulatory Visit: Admitting: Sports Medicine

## 2023-07-07 VITALS — BP 139/84 | HR 96 | Temp 98.7°F | Resp 20 | Ht 63.0 in | Wt 144.1 lb

## 2023-07-07 DIAGNOSIS — L271 Localized skin eruption due to drugs and medicaments taken internally: Secondary | ICD-10-CM | POA: Diagnosis not present

## 2023-07-07 DIAGNOSIS — J101 Influenza due to other identified influenza virus with other respiratory manifestations: Secondary | ICD-10-CM | POA: Diagnosis not present

## 2023-07-07 DIAGNOSIS — L03031 Cellulitis of right toe: Secondary | ICD-10-CM | POA: Diagnosis not present

## 2023-07-07 DIAGNOSIS — L539 Erythematous condition, unspecified: Secondary | ICD-10-CM | POA: Diagnosis not present

## 2023-07-07 DIAGNOSIS — T375X5A Adverse effect of antiviral drugs, initial encounter: Secondary | ICD-10-CM | POA: Diagnosis not present

## 2023-07-07 DIAGNOSIS — T50905A Adverse effect of unspecified drugs, medicaments and biological substances, initial encounter: Secondary | ICD-10-CM | POA: Diagnosis not present

## 2023-07-07 DIAGNOSIS — L03039 Cellulitis of unspecified toe: Secondary | ICD-10-CM | POA: Diagnosis not present

## 2023-07-07 DIAGNOSIS — R6889 Other general symptoms and signs: Secondary | ICD-10-CM | POA: Diagnosis not present

## 2023-07-07 DIAGNOSIS — E063 Autoimmune thyroiditis: Secondary | ICD-10-CM | POA: Diagnosis not present

## 2023-07-07 DIAGNOSIS — T364X5A Adverse effect of tetracyclines, initial encounter: Secondary | ICD-10-CM | POA: Diagnosis not present

## 2023-07-07 DIAGNOSIS — J111 Influenza due to unidentified influenza virus with other respiratory manifestations: Secondary | ICD-10-CM | POA: Diagnosis not present

## 2023-07-07 DIAGNOSIS — T368X5A Adverse effect of other systemic antibiotics, initial encounter: Secondary | ICD-10-CM | POA: Diagnosis not present

## 2023-07-07 DIAGNOSIS — X58XXXA Exposure to other specified factors, initial encounter: Secondary | ICD-10-CM | POA: Diagnosis not present

## 2023-07-07 LAB — POC COVID19 BINAXNOW: SARS Coronavirus 2 Ag: NEGATIVE

## 2023-07-07 LAB — POCT INFLUENZA A/B
Influenza A, POC: NEGATIVE
Influenza B, POC: POSITIVE — AB

## 2023-07-07 LAB — POCT RAPID STREP A (OFFICE): Rapid Strep A Screen: NEGATIVE

## 2023-07-07 MED ORDER — DOXYCYCLINE HYCLATE 100 MG PO TABS
100.0000 mg | ORAL_TABLET | Freq: Two times a day (BID) | ORAL | 0 refills | Status: AC
Start: 2023-07-07 — End: 2023-07-14

## 2023-07-07 MED ORDER — PREDNISONE 50 MG PO TABS: 50.0000 mg | ORAL_TABLET | Freq: Every day | ORAL | 0 refills | Status: DC

## 2023-07-07 MED ORDER — OSELTAMIVIR PHOSPHATE 75 MG PO CAPS: 75.0000 mg | ORAL_CAPSULE | Freq: Two times a day (BID) | ORAL | 0 refills | Status: DC

## 2023-07-07 NOTE — Assessment & Plan Note (Signed)
 Left great toe paronychia back in 2023, now having this on the right side. Adding doxycycline.

## 2023-07-07 NOTE — Progress Notes (Signed)
    Procedures performed today:    None.  Independent interpretation of notes and tests performed by another provider:   None.  Brief History, Exam, Impression, and Recommendations:    Influenza B 2 and half days of malaise, sore throat, cough. No GI symptoms. Severe sore throat. COVID and strep negative, influenza B positive, she is fairly close to the threshold so we will go ahead and do Tamiflu. Considering the severity of her pharyngitis I will also do a course of steroids.   Paronychia of great toe of right foot Left great toe paronychia back in 2023, now having this on the right side. Adding doxycycline.  I spent 30 minutes of total time managing this patient today, this includes chart review, face to face, and non-face to face time.  ____________________________________________ Ihor Austin. Benjamin Stain, M.D., ABFM., CAQSM., AME. Primary Care and Sports Medicine [facility]  Adjunct Professor of Family Medicine  [employer]  Restaurant manager, fast food

## 2023-07-07 NOTE — Assessment & Plan Note (Signed)
 2 and half days of malaise, sore throat, cough. No GI symptoms. Severe sore throat. COVID and strep negative, influenza B positive, she is fairly close to the threshold so we will go ahead and do Tamiflu. Considering the severity of her pharyngitis I will also do a course of steroids.

## 2023-07-30 ENCOUNTER — Other Ambulatory Visit: Payer: Self-pay | Admitting: Family Medicine

## 2023-07-30 DIAGNOSIS — I10 Essential (primary) hypertension: Secondary | ICD-10-CM

## 2023-07-30 DIAGNOSIS — N920 Excessive and frequent menstruation with regular cycle: Secondary | ICD-10-CM

## 2023-08-19 ENCOUNTER — Other Ambulatory Visit: Payer: Self-pay | Admitting: Family Medicine

## 2023-08-20 ENCOUNTER — Encounter: Payer: Self-pay | Admitting: Family Medicine

## 2023-08-20 ENCOUNTER — Ambulatory Visit: Admitting: Family Medicine

## 2023-08-20 VITALS — BP 135/82 | HR 80 | Temp 98.3°F | Ht 63.0 in | Wt 144.0 lb

## 2023-08-20 DIAGNOSIS — J40 Bronchitis, not specified as acute or chronic: Secondary | ICD-10-CM

## 2023-08-20 MED ORDER — PREDNISONE 20 MG PO TABS: 40.0000 mg | ORAL_TABLET | Freq: Every day | ORAL | 0 refills | Status: DC

## 2023-08-20 NOTE — Progress Notes (Signed)
   Acute Office Visit  Subjective:     Patient ID: Tina Pearson, female    DOB: 1980-06-16, 43 y.o.   MRN: [id number]  Chief Complaint  Patient presents with   chest congestion    HPI Patient is in today for 2 weeks chest congestion in her chest, burning in her chest. Taking Dayquail and Nyquail.  Feelt nauseate.  Last week had fever and chills. The cough is less productive than it was. Sinuses are OK.   ROS      Objective:    BP 135/82   Pulse 80   Temp 98.3 F (36.8 C) (Oral)   Ht 5\' 3"  (1.6 m)   Wt 144 lb (65.3 kg)   SpO2 98%   BMI 25.51 kg/m    Physical Exam Constitutional:      Appearance: Normal appearance.  HENT:     Head: Normocephalic and atraumatic.     Right Ear: Tympanic membrane, ear canal and external ear normal. There is no impacted cerumen.     Left Ear: Tympanic membrane, ear canal and external ear normal. There is no impacted cerumen.     Nose: Nose normal.     Mouth/Throat:     Pharynx: Oropharynx is clear.  Eyes:     Conjunctiva/sclera: Conjunctivae normal.  Cardiovascular:     Rate and Rhythm: Normal rate and regular rhythm.  Pulmonary:     Effort: Pulmonary effort is normal.     Breath sounds: Normal breath sounds.  Musculoskeletal:     Cervical back: Neck supple. No tenderness.  Lymphadenopathy:     Cervical: No cervical adenopathy.  Skin:    General: Skin is warm and dry.  Neurological:     Mental Status: She is alert and oriented to person, place, and time.  Psychiatric:        Mood and Affect: Mood normal.     No results found for any visits on 08/20/23.      Assessment & Plan:   Problem List Items Addressed This Visit   None Visit Diagnoses       Bronchitis    -  Primary      Bronchitis - will tx with prednisone.  Call if not better in one week Recommend PPI while on the prednisone to protect the stomach and reduce any GERD/reflux sxs.  Stay well hydrated.  Run humudifier.    Meds ordered this encounter   Medications   predniSONE (DELTASONE) 20 MG tablet    Sig: Take 2 tablets (40 mg total) by mouth daily with breakfast.    Dispense:  10 tablet    Refill:  0    No follow-ups on file.  Duaine German, MD

## 2023-08-20 NOTE — Progress Notes (Signed)
 Cough and chest congestion x 2 weeks. She has been taking dayquil and nyquil to the point that it causes her to feel nauseated.

## 2023-10-13 ENCOUNTER — Encounter: Payer: Self-pay | Admitting: Family Medicine

## 2023-10-13 ENCOUNTER — Ambulatory Visit: Payer: Self-pay

## 2023-10-13 MED ORDER — FROVATRIPTAN SUCCINATE 2.5 MG PO TABS: ORAL_TABLET | ORAL | 3 refills | Status: DC

## 2023-10-13 NOTE — Telephone Encounter (Signed)
 Pt shows schld tomorrow with Sandy Crumb- attempted call to patient to check severity of symptoms - no answer - left a voice mail message.

## 2023-10-13 NOTE — Telephone Encounter (Signed)
 Patient requesting rx rf of Frovatriptan  2.5mg  as 90 day supply  Patient states 90 day supply is required before [facility] will fill medication. Last written 10/06/2022  ( Is the qty of #27 a 90 day supply ?)  If so I will contact [facility] to let them know.

## 2023-10-13 NOTE — Telephone Encounter (Signed)
 FYI Only or Action Required?: FYI only for provider  Patient was last seen in primary care on 08/20/2023 by Cydney Draft, MD. Called Nurse Triage reporting Insect Bite. Symptoms began yesterday. Interventions attempted: OTC medications: Ibuprofen and Ice/heat application. Symptoms are: unchanged.  Triage Disposition: See Physician Within 24 Hours  Patient/caregiver understands and will follow disposition?: Yes  Appt scheduled for tomorrow, advised pt to try some benadryl tonight to help prevent further swelling.   Copied from CRM [id number]. 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 TYPE: "What type of sting was it?" (bee, yellow jacket, etc.)      2. ONSET: "When did it occur?"      Yesterday 2 pm  3. LOCATION: "Where is the sting located?"  "How many stings?"     R wrist  4. SWELLING SIZE: "How big is the swelling?" (e.g., inches or cm)     Yes down to hand and up arm  5. REDNESS: "Is the area red or pink?" If Yes, ask: "What size is area of redness?" (e.g., inches or cm). "When did the redness start?"     yes 6. PAIN: "Is there any pain?" If Yes, ask: "How bad is it?"  (Scale 1-10; or mild, moderate, severe)     Yes and hot to touch  7. ITCHING: "Is there any itching?" If Yes, ask: "How bad is it?"      yes 8. RESPIRATORY DISTRESS: "Describe your breathing."     no 9. PRIOR REACTIONS: "Have you had any severe allergic reactions to stings in the past?" if yes, ask: "What happened?"     Had to be hospitalized  10. OTHER SYMPTOMS: "Do you have any other symptoms?" (e.g., abdomen  pain, face or tongue swelling, new rash elsewhere, vomiting)       No  Has tried ice and and Ibuprofen and not helping, unable to take benadryl  Protocols used: Bee or Yellow Jacket Sting-A-AH

## 2023-10-14 ENCOUNTER — Ambulatory Visit: Admitting: Physician Assistant

## 2023-10-15 ENCOUNTER — Telehealth: Payer: Self-pay | Admitting: Family Medicine

## 2023-10-15 NOTE — Telephone Encounter (Signed)
 She called her insurance to find out what is covered or talked to her pharmacist?  I have not received any notifications from her insurance company in this regard

## 2023-10-15 NOTE — Telephone Encounter (Signed)
 Copied from CRM [id number]. Topic: Clinical - Medication Question >> Oct 15, 2023  2:50 PM Karole Pacer C wrote: Reason for CRM: requires use of alt medication for the following medication frovatriptan  (FROVA ) 2.5 MG tablet and insurance would like an alternative that is covered by the patients insurance [phone] reference # [id number]

## 2023-10-16 NOTE — Telephone Encounter (Signed)
 Okay, thank you for digging into that.  That makes much more sense.  I could not figure out why they denied the 27 tabs which was a 90-day supply saying that it was not a 90-day supply.  And then sent it back saying that I had written for too much.  That makes more sense that it is just not approved at all and that she will have to use one of the preferred drugs before we can initiate a PA.  Definitely happy to change it to one of the alternatives after she gives us  a call back.

## 2023-10-16 NOTE — Telephone Encounter (Signed)
 Called [facility] again . This time was transferred to a different department who handles prescriptions for this patient [phone]) spoke with Adaline Ada. Was told that the  Frovatriptan  would require a prior authorization but  preferred medications are  sumatriptan 25mg , 50mg , or 100mg ; sumatriptan nasal spray either 5mg  or 20mg  ; nortripine either 1mg , 2mg  or 5 mg; rizatriptan Ben. 4mg  or 10mg . Patient is showing an allergy to Sumatriptan. Pateitn has been on Frovatriptatn since before 2014- so I attempted a call to patient to see if she had tried and failed  the Nortriptan or rizatriptan . Had to leave a voice mail requesting a return call.  If she has not tried and failed these two the medication would have to attempted . If she has tried we will need to proceed with a prior authorization.

## 2023-10-16 NOTE — Telephone Encounter (Signed)
 Spoke to patient she has tried and failed nortriptan and rizatriptan and many other treatments as well - has been on the medication requested ; frovastatin since before 2012 - she has an allergy to Sumatriptan.  Forwarding this information to our prior authorization department to complete PA

## 2023-10-16 NOTE — Telephone Encounter (Signed)
 [facility]oke with [facility] representative  State the frovatriptan  is a covered medication through patient plan as qty #9 tablets for 30 days or #27 tablets for 90 days.  If larger qty needed will need a PA

## 2023-10-16 NOTE — Telephone Encounter (Signed)
 Ok then [facility] can filled the Rx for 6/2.    I sent 27 tab for 90 days supply. I only changed it bc they said it wasn't right.    This is crazy.    We will need to call [facility].

## 2023-10-19 ENCOUNTER — Telehealth: Payer: Self-pay

## 2023-10-19 ENCOUNTER — Other Ambulatory Visit (HOSPITAL_COMMUNITY): Payer: Self-pay

## 2023-10-19 NOTE — Telephone Encounter (Signed)
 Pharmacy Patient Advocate Encounter   Received notification from Pt Calls Messages that prior authorization for Frovatriptan  2.5mg  tabs is required/requested.   Insurance verification completed.   The patient is insured through [employer] .   Per test claim: PA required; PA submitted to above mentioned insurance via CoverMyMeds Key/confirmation #/EOC [id number] Status is pending

## 2023-10-19 NOTE — Telephone Encounter (Signed)
 Pharmacy Patient Advocate Encounter  Received notification from [facility] that Prior Authorization for Frovatriptan  2.5mg  tabs has been APPROVED from 09/19/23 to 10/18/24   PA #/Case ID/Reference #: [id number]

## 2023-10-19 NOTE — Telephone Encounter (Signed)
 Attempted call to patient. Left a voice mail message requesting a return call.

## 2023-10-20 NOTE — Telephone Encounter (Signed)
 Patient informed.

## 2023-10-21 ENCOUNTER — Other Ambulatory Visit: Payer: Self-pay

## 2023-10-21 ENCOUNTER — Telehealth: Payer: Self-pay

## 2023-10-21 MED ORDER — FROVATRIPTAN SUCCINATE 2.5 MG PO TABS
ORAL_TABLET | ORAL | 3 refills | Status: DC
Start: 2023-10-21 — End: 2023-10-23

## 2023-10-21 NOTE — Telephone Encounter (Signed)
 Copied from CRM [id number]. Topic: General - Call Back - No Documentation >> Oct 21, 2023 12:26 PM Karole Pacer C wrote: Reason for CRM: Patient would like for a call back from [place]. Patients call back # is [phone]

## 2023-10-21 NOTE — Telephone Encounter (Signed)
 Resent Frovatriptan  as Qty#9 at patient's request - approved by Dr. Greer Leak.  PA approval is in place.

## 2023-10-23 ENCOUNTER — Other Ambulatory Visit: Payer: Self-pay

## 2023-10-23 MED ORDER — FROVATRIPTAN SUCCINATE 2.5 MG PO TABS: ORAL_TABLET | ORAL | 1 refills | Status: AC

## 2023-10-23 NOTE — Telephone Encounter (Signed)
 Called [facility] and spoke with [place]. Attempted to transfer me to the pharmacist but held for and still had not connected.  Tina Pearson states she thought the problem was that the qty of #27 was stated in script as 34 day supply.  I told her that there was not any documentation in script to say that what day supply the #27 was meant to be  Resent the script again with a note to the pharmacy stating that the #27 should be given as a 90 day supply.  Will see if this is helpful in getting the patient's prescription sent .

## 2023-10-26 NOTE — Telephone Encounter (Signed)
 Patient informed. States she thinks the medication is now processing correctly.

## 2023-11-24 ENCOUNTER — Ambulatory Visit: Payer: BC Managed Care – PPO | Admitting: Family Medicine

## 2023-11-25 DIAGNOSIS — R92333 Mammographic heterogeneous density, bilateral breasts: Secondary | ICD-10-CM | POA: Diagnosis not present

## 2023-11-25 DIAGNOSIS — Z1231 Encounter for screening mammogram for malignant neoplasm of breast: Secondary | ICD-10-CM | POA: Diagnosis not present

## 2023-11-25 LAB — HM MAMMOGRAPHY

## 2023-12-02 ENCOUNTER — Encounter: Payer: Self-pay | Admitting: Family Medicine

## 2024-01-05 ENCOUNTER — Encounter: Payer: Self-pay | Admitting: Sports Medicine

## 2024-03-14 DIAGNOSIS — Z1330 Encounter for screening examination for mental health and behavioral disorders, unspecified: Secondary | ICD-10-CM | POA: Diagnosis not present

## 2024-03-14 DIAGNOSIS — E063 Autoimmune thyroiditis: Secondary | ICD-10-CM | POA: Diagnosis not present

## 2024-03-29 DIAGNOSIS — N939 Abnormal uterine and vaginal bleeding, unspecified: Secondary | ICD-10-CM | POA: Diagnosis not present

## 2024-03-29 DIAGNOSIS — N938 Other specified abnormal uterine and vaginal bleeding: Secondary | ICD-10-CM | POA: Diagnosis not present

## 2024-03-29 DIAGNOSIS — Z3202 Encounter for pregnancy test, result negative: Secondary | ICD-10-CM | POA: Diagnosis not present

## 2024-03-29 DIAGNOSIS — R9389 Abnormal findings on diagnostic imaging of other specified body structures: Secondary | ICD-10-CM | POA: Diagnosis not present

## 2024-04-09 ENCOUNTER — Encounter: Payer: Self-pay | Admitting: Family Medicine

## 2024-04-11 ENCOUNTER — Encounter: Payer: Self-pay | Admitting: Family Medicine

## 2024-05-02 DIAGNOSIS — Z01812 Encounter for preprocedural laboratory examination: Secondary | ICD-10-CM | POA: Diagnosis not present

## 2024-06-06 ENCOUNTER — Encounter: Admitting: Family Medicine

## 2024-06-14 ENCOUNTER — Encounter: Admitting: Family Medicine

## 2024-07-19 ENCOUNTER — Encounter: Admitting: Family Medicine
